# Patient Record
Sex: Female | Born: 1965 | Race: White | Hispanic: No | Marital: Married | Smoking: Never smoker
Health system: Southern US, Community
[De-identification: ages and names within clinical notes are randomized; demographics above are authoritative.]

## PROBLEM LIST (undated history)

## (undated) DIAGNOSIS — F329 Major depressive disorder, single episode, unspecified: Secondary | ICD-10-CM

## (undated) DIAGNOSIS — R5383 Other fatigue: Secondary | ICD-10-CM

## (undated) DIAGNOSIS — F32A Depression, unspecified: Secondary | ICD-10-CM

## (undated) DIAGNOSIS — Z87898 Personal history of other specified conditions: Secondary | ICD-10-CM

## (undated) DIAGNOSIS — I459 Conduction disorder, unspecified: Secondary | ICD-10-CM

## (undated) HISTORY — PX: OTHER SURGICAL HISTORY: SHX169

## (undated) HISTORY — DX: Conduction disorder, unspecified: I45.9

## (undated) HISTORY — DX: Other fatigue: R53.83

## (undated) HISTORY — DX: Personal history of other specified conditions: Z87.898

---

## 1998-08-30 ENCOUNTER — Ambulatory Visit (HOSPITAL_COMMUNITY): Admission: RE | Admit: 1998-08-30 | Discharge: 1998-08-30 | Payer: Self-pay | Admitting: Gastroenterology

## 1999-02-13 ENCOUNTER — Other Ambulatory Visit: Admission: RE | Admit: 1999-02-13 | Discharge: 1999-02-13 | Payer: Self-pay | Admitting: Obstetrics & Gynecology

## 2000-05-14 ENCOUNTER — Other Ambulatory Visit: Admission: RE | Admit: 2000-05-14 | Discharge: 2000-05-14 | Payer: Self-pay | Admitting: Obstetrics and Gynecology

## 2001-05-20 ENCOUNTER — Other Ambulatory Visit: Admission: RE | Admit: 2001-05-20 | Discharge: 2001-05-20 | Payer: Self-pay | Admitting: Obstetrics and Gynecology

## 2002-06-28 ENCOUNTER — Encounter: Payer: Self-pay | Admitting: Obstetrics and Gynecology

## 2002-06-28 ENCOUNTER — Ambulatory Visit (HOSPITAL_COMMUNITY): Admission: RE | Admit: 2002-06-28 | Discharge: 2002-06-28 | Payer: Self-pay | Admitting: Obstetrics and Gynecology

## 2002-11-07 ENCOUNTER — Inpatient Hospital Stay (HOSPITAL_COMMUNITY): Admission: AD | Admit: 2002-11-07 | Discharge: 2002-11-10 | Payer: Self-pay | Admitting: Obstetrics and Gynecology

## 2003-01-27 ENCOUNTER — Other Ambulatory Visit: Admission: RE | Admit: 2003-01-27 | Discharge: 2003-01-27 | Payer: Self-pay | Admitting: *Deleted

## 2004-05-21 ENCOUNTER — Other Ambulatory Visit: Admission: RE | Admit: 2004-05-21 | Discharge: 2004-05-21 | Payer: Self-pay | Admitting: Obstetrics and Gynecology

## 2004-09-01 ENCOUNTER — Emergency Department (HOSPITAL_COMMUNITY): Admission: EM | Admit: 2004-09-01 | Discharge: 2004-09-01 | Payer: Self-pay | Admitting: Emergency Medicine

## 2005-05-03 ENCOUNTER — Encounter: Admission: RE | Admit: 2005-05-03 | Discharge: 2005-05-03 | Payer: Self-pay | Admitting: Obstetrics and Gynecology

## 2005-06-19 ENCOUNTER — Other Ambulatory Visit: Admission: RE | Admit: 2005-06-19 | Discharge: 2005-06-19 | Payer: Self-pay | Admitting: Obstetrics and Gynecology

## 2006-06-04 ENCOUNTER — Encounter: Admission: RE | Admit: 2006-06-04 | Discharge: 2006-06-04 | Payer: Self-pay | Admitting: Obstetrics and Gynecology

## 2007-04-07 DIAGNOSIS — G4733 Obstructive sleep apnea (adult) (pediatric): Secondary | ICD-10-CM | POA: Insufficient documentation

## 2007-04-23 ENCOUNTER — Ambulatory Visit (HOSPITAL_BASED_OUTPATIENT_CLINIC_OR_DEPARTMENT_OTHER): Admission: RE | Admit: 2007-04-23 | Discharge: 2007-04-23 | Payer: Self-pay | Admitting: Family Medicine

## 2007-04-26 ENCOUNTER — Ambulatory Visit: Payer: Self-pay | Admitting: Internal Medicine

## 2007-07-03 ENCOUNTER — Emergency Department (HOSPITAL_COMMUNITY): Admission: EM | Admit: 2007-07-03 | Discharge: 2007-07-03 | Payer: Self-pay | Admitting: Emergency Medicine

## 2007-11-24 ENCOUNTER — Encounter: Admission: RE | Admit: 2007-11-24 | Discharge: 2007-11-24 | Payer: Self-pay | Admitting: Obstetrics and Gynecology

## 2008-12-02 DIAGNOSIS — F320 Major depressive disorder, single episode, mild: Secondary | ICD-10-CM | POA: Insufficient documentation

## 2009-05-18 ENCOUNTER — Encounter: Admission: RE | Admit: 2009-05-18 | Discharge: 2009-05-18 | Payer: Self-pay | Admitting: Obstetrics and Gynecology

## 2009-09-28 ENCOUNTER — Ambulatory Visit: Payer: Self-pay | Admitting: Oncology

## 2009-10-06 LAB — CBC WITH DIFFERENTIAL/PLATELET
BASO%: 0.4 % (ref 0.0–2.0)
Basophils Absolute: 0 10*3/uL (ref 0.0–0.1)
EOS%: 0.9 % (ref 0.0–7.0)
HCT: 37.2 % (ref 34.8–46.6)
HGB: 12.4 g/dL (ref 11.6–15.9)
LYMPH%: 33 % (ref 14.0–49.7)
MCH: 28.5 pg (ref 25.1–34.0)
MCHC: 33.3 g/dL (ref 31.5–36.0)
MCV: 85.9 fL (ref 79.5–101.0)
MONO%: 12.2 % (ref 0.0–14.0)
NEUT%: 53.5 % (ref 38.4–76.8)
Platelets: 158 10*3/uL (ref 145–400)

## 2009-10-06 LAB — MORPHOLOGY

## 2009-10-06 LAB — CHCC SMEAR

## 2009-10-10 LAB — PROTEIN ELECTROPHORESIS, SERUM
Albumin ELP: 60.7 % (ref 55.8–66.1)
Alpha-2-Globulin: 9.8 % (ref 7.1–11.8)
Beta Globulin: 7.7 % — ABNORMAL HIGH (ref 4.7–7.2)
Total Protein, Serum Electrophoresis: 6.3 g/dL (ref 6.0–8.3)

## 2009-10-10 LAB — COMPREHENSIVE METABOLIC PANEL
ALT: 11 U/L (ref 0–35)
AST: 11 U/L (ref 0–37)
CO2: 25 mEq/L (ref 19–32)
Calcium: 8.8 mg/dL (ref 8.4–10.5)
Chloride: 105 mEq/L (ref 96–112)
Creatinine, Ser: 0.78 mg/dL (ref 0.40–1.20)
Sodium: 138 mEq/L (ref 135–145)
Total Protein: 6.3 g/dL (ref 6.0–8.3)

## 2009-10-10 LAB — IRON AND TIBC
%SAT: 67 % — ABNORMAL HIGH (ref 20–55)
Iron: 287 ug/dL — ABNORMAL HIGH (ref 42–145)

## 2009-10-10 LAB — HEPATITIS B SURFACE ANTIGEN: Hepatitis B Surface Ag: NEGATIVE

## 2009-10-10 LAB — HEPATITIS B SURFACE ANTIBODY,QUALITATIVE: Hep B S Ab: NEGATIVE

## 2009-10-10 LAB — ANA: Anti Nuclear Antibody(ANA): NEGATIVE

## 2009-10-10 LAB — VITAMIN B12: Vitamin B-12: 198 pg/mL — ABNORMAL LOW (ref 211–911)

## 2009-10-10 LAB — FERRITIN: Ferritin: 6 ng/mL — ABNORMAL LOW (ref 10–291)

## 2009-12-07 ENCOUNTER — Ambulatory Visit: Payer: Self-pay | Admitting: Oncology

## 2009-12-08 LAB — CBC WITH DIFFERENTIAL/PLATELET
Basophils Absolute: 0 10*3/uL (ref 0.0–0.1)
Eosinophils Absolute: 0 10*3/uL (ref 0.0–0.5)
HCT: 33 % — ABNORMAL LOW (ref 34.8–46.6)
LYMPH%: 28.1 % (ref 14.0–49.7)
MCV: 89.9 fL (ref 79.5–101.0)
MONO%: 8.3 % (ref 0.0–14.0)
NEUT#: 3.7 10*3/uL (ref 1.5–6.5)
NEUT%: 62.6 % (ref 38.4–76.8)
Platelets: 152 10*3/uL (ref 145–400)
RBC: 3.68 10*6/uL — ABNORMAL LOW (ref 3.70–5.45)

## 2009-12-08 LAB — CHCC SMEAR

## 2010-02-07 ENCOUNTER — Ambulatory Visit: Payer: Self-pay | Admitting: Oncology

## 2010-02-09 LAB — CBC WITH DIFFERENTIAL/PLATELET
Basophils Absolute: 0 10*3/uL (ref 0.0–0.1)
EOS%: 0.8 % (ref 0.0–7.0)
Eosinophils Absolute: 0 10*3/uL (ref 0.0–0.5)
HCT: 38.1 % (ref 34.8–46.6)
HGB: 12.9 g/dL (ref 11.6–15.9)
MCH: 30 pg (ref 25.1–34.0)
MCV: 88.6 fL (ref 79.5–101.0)
MONO%: 8.3 % (ref 0.0–14.0)
NEUT#: 3.9 10*3/uL (ref 1.5–6.5)
NEUT%: 62.9 % (ref 38.4–76.8)
lymph#: 1.7 10*3/uL (ref 0.9–3.3)

## 2010-04-11 ENCOUNTER — Ambulatory Visit: Payer: Self-pay | Admitting: Oncology

## 2010-04-13 LAB — CBC WITH DIFFERENTIAL/PLATELET
Basophils Absolute: 0 10*3/uL (ref 0.0–0.1)
EOS%: 1 % (ref 0.0–7.0)
HCT: 38.9 % (ref 34.8–46.6)
HGB: 13.6 g/dL (ref 11.6–15.9)
LYMPH%: 30.3 % (ref 14.0–49.7)
MCH: 31.1 pg (ref 25.1–34.0)
MCV: 89.3 fL (ref 79.5–101.0)
MONO%: 10.5 % (ref 0.0–14.0)
NEUT%: 57.7 % (ref 38.4–76.8)
Platelets: 168 10*3/uL (ref 145–400)

## 2010-04-13 LAB — COMPREHENSIVE METABOLIC PANEL
AST: 11 U/L (ref 0–37)
Alkaline Phosphatase: 44 U/L (ref 39–117)
BUN: 14 mg/dL (ref 6–23)
Creatinine, Ser: 0.86 mg/dL (ref 0.40–1.20)

## 2010-10-30 ENCOUNTER — Other Ambulatory Visit: Payer: Self-pay | Admitting: Obstetrics and Gynecology

## 2010-10-30 DIAGNOSIS — Z1231 Encounter for screening mammogram for malignant neoplasm of breast: Secondary | ICD-10-CM

## 2010-11-01 ENCOUNTER — Ambulatory Visit
Admission: RE | Admit: 2010-11-01 | Discharge: 2010-11-01 | Disposition: A | Discharge status: Home / Self Care | Payer: 59 | Source: Ambulatory Visit | Attending: Obstetrics and Gynecology | Admitting: Obstetrics and Gynecology

## 2010-11-01 DIAGNOSIS — Z1231 Encounter for screening mammogram for malignant neoplasm of breast: Secondary | ICD-10-CM

## 2010-11-20 NOTE — Procedures (Signed)
Gina Spencer, RIEGER        ACCOUNT NO.:  [id number]   MEDICAL RECORD NO.:  [id number]           PATIENT TYPE:  OUT   LOCATION:  SLEEP CENTER                 FACILITY:  [facility]   PHYSICIAN:  Clinton D. Maple Hudson, MD, FCCP, FACPDATE OF BIRTH:  1965/10/[birth year]   DATE OF STUDY:  04/23/2007                            NOCTURNAL POLYSOMNOGRAM   REFERRING PHYSICIAN:  Tammy R. Collins Scotland, M.D.   INDICATION FOR STUDY:  Hypersomnia with sleep apnea.   EPWORTH SLEEPINESS SCORE:  14/24.  BMI 32.9, weight 198 pounds.   MEDICATIONS:  Home medications are listed and reviewed.   SLEEP ARCHITECTURE:  Total sleep time 288 minutes with sleep efficiency  69%.  Stage I was 3%, stage II 60%, stage III absent.  REM 6% of total  sleep time.  Sleep latency 59 minutes, REM latency 268 minutes.  Awake  after sleep onset 69 minutes.  Arousal index 12.6.  Tandem was taken at  bedtime.   RESPIRATORY DATA:  Apnea/hypopnea index (AHI, RDI) 6.5 obstructive  events per hour indicating mild obstructive sleep apnea/hypopnea  syndrome (normal range 0-5 per hour).  This reflected a total of four  mixed apneas and 207 hypopneas.  Most sleep and events were recorded  while supine.  REM AHI 28.7.  There were insufficient events to permit  CPAP titration by split protocol on this study night.   OXYGEN DATA:  Mild to moderate snoring with oxygen desaturation to a  nadir of 80%.  Mean oxygen saturation through the study was 95% on room  air.   CARDIAC DATA:  Normal sinus rhythm.   MOVEMENT-PARASOMNIA:  A total of 32 limb jerks were recorded of which  seven were associated with arousal or awakening for a periodic limb  movement with arousal index of 1.5 per hour which is of doubtful  significance.   IMPRESSIONS-RECOMMENDATIONS:  1. Very mild obstructive sleep apnea/hypopnea syndrome, AHI 6.5 per      hour (normal range 0-5 per hour).  Events were more common while      supine suggesting that treatment may emphasize an  encouragement to      sleep on flatter back.  Mild to moderate snoring with oxygen      desaturation to a nadir of 80%.  2. The patient raised question of bruxism, but none was observed on      this study.  3. Minimal periodic limb movement in sleep with arousal, 1.5 per hour.      Clinton D. Maple Hudson, MD, [facility], FACP  Diplomate, Biomedical engineer of Sleep Medicine  Electronically Signed     CDY/MEDQ  D:  04/26/2007 12:23:44  T:  04/27/2007 11:40:55  Job:  [id number]

## 2010-11-23 NOTE — H&P (Signed)
NAMEHINDY, PERRAULT                    ACCOUNT NO.:  [id number]   MEDICAL RECORD NO.:  [id number]                   PATIENT TYPE:  INP   LOCATION:  9147                                 FACILITY:  [facility]   PHYSICIAN:  Janine Limbo, M.D.            DATE OF BIRTH:  1965-12-04   DATE OF ADMISSION:  11/07/2002  DATE OF DISCHARGE:                                HISTORY & PHYSICAL   HISTORY OF PRESENT ILLNESS:  [name] is a 45 year old Gravida III,  Para I, 0/1/1 at 67 and 5/7th weeks with estimated date of delivery of Nov 29, 2002 who presents with spontaneous rupture of membranes for clear fluid.  Questionable time of rupture of membrane, possible as long as yesterday, Nov 06, 2002 at 1:30 to 2:00 p.m. in the afternoon. She reports positive fetal  movement. No bleeding. She is contracting mildly and irregularly. The  patient had a repeat Cesarean section scheduled for Nov 18, 2002. Her  pregnancy has been followed at [facility] and is remarkable for 1. Pervious Cesarean section, desires repeat.  2. Advanced maternal age. The patient had CVS testing early in the pregnancy  which was within normal limits. 3. She has a history of trisomy 30 with a  previous pregnancy which she ended in termination. 4. History of  cryosurgery. 5. History of preterm labor with term delivery. 6. History of  pruritus. 7. Positive risk with negative titers. 8. Group B strep negative.   OB HISTORY:  In 1996, the patient had a primary low transverse Cesarean  section at term with the birth of a 7 pound female infant named Gina Spencer. In  2003, the patient had a second trimester pregnancy with a trisomy 21 fetus  with termination and the present pregnancy.   ALLERGIES:  No known drug allergies.   SOCIAL HISTORY:  Denies the use of tobacco, alcohol or illicit drugs.   HOSPITAL COURSE:  The patient was initially evaluated at the office at  [facility] on March 29, 2002  at approximately six weeks gestation. Estimated date of confinement  determined by date and confirmed with pregnancy ultrasound. She had a CVS  testing early in the pregnancy which was within normal limits. Ultrasound at  18 weeks showed normal anatomy and growth. The patient has been size equal  to dates throughout. Normotensive with no proteinuria. Her pregnancy has  otherwise, been unremarkable.   PREMEDICATION:  On March 29, 2002 revealed hemoglobin and hematocrit  13.6 and 40.4. Platelets 156,000. Blood type and Rh 0+. Antibody screen  negative. Toxic titer negative. VDRL nonreactive. Rubella immune. Hepatitis  B surface antigen negative. CF testing negative. CVS within normal limits.  PAP smear showed benign reactive changes. GC and Chlamydia negative. One  hour glucose challenge at 18 weeks was 64 and at 28 weeks, one hour glucose  challenge was 141. Three hour GTT was within  normal limits. Hemoglobin at 28  weeks was 12.5. Culture of the vaginal tract at 36 weeks negative for group  B strep.   PAST MEDICAL HISTORY:  Significant for abnormal PAP smear with cryosurgery  in 1995. History of Chlamydia in 1987 and condyloma. The patient with a  history of colitis. She has a history of migraine headaches but none since  1996.   FAMILY HISTORY:  Maternal grandfather with a history of chronic  hypertension. The patient and her mother with history of anemia. Maternal  grandfather with history of chronic obstructive pulmonary disease and  diabetes mellitus. Paternal grandfather with history of stroke. Maternal  grandmother with Alzheimer's disease.   GENETIC HISTORY:  The patient is age 52 years. Her previous pregnancy found  trisomy 45 and patient ended that pregnancy with termination.   SOCIAL HISTORY:  The patient is a 45 year old married Caucasian female. Her  husband, Gina Spencer, is involved and supportive. They  are Ep[employer] in their  faith.   REVIEW OF SYSTEMS:  Is as described above. The patient is typical of one  with uterine pregnancy at 63 and 1/2 weeks with premature rupture of  membranes. Clear fluids. No labor.   PHYSICAL EXAMINATION:  VITAL SIGNS: Stable. Afebrile.  HEENT: Unremarkable.  HEART: Regular rate and rhythm.  LUNGS: Clear.  ABDOMEN: Gravid in it's contour.  GU: Uterine fundus is noted to extend 37 cm above the level of the pubic  symphysis. Leopold's maneuver is longitudinal and cephalic presentation.  Estimated fetal weight is 6 pounds. Fetal heart rate baseline is 150's.  Positive variability. Positive accelerations. Reactive. Contractions are  mild and irregular. Sterile speculum examination finds positive pooling,  positive nitrazine, positive fern. Cervix is long and closed.  EXTREMITIES: No pathologic edema. Deep tendon reflexes are 1+ with no  clonus.   ASSESSMENT:  Intrauterine pregnancy at 41 and 1/2 weeks with premature  rupture of membranes.   PLAN:  Admit per Dr. Marline Backbone. Prepare for Cesarean section.     Rica Koyanagi, C.N.M.               Janine Limbo, M.D.    SDM/MEDQ  D:  11/07/2002  T:  11/07/2002  Job:  [id number]

## 2010-11-23 NOTE — Discharge Summary (Signed)
   Gina Spencer, Gina Spencer                    ACCOUNT NO.:  [id number]   MEDICAL RECORD NO.:  [id number]                   PATIENT TYPE:  INP   LOCATION:  9147                                 FACILITY:  [facility]   PHYSICIAN:  Naima A. Dillard, M.D.              DATE OF BIRTH:  Oct 10, 1965   DATE OF ADMISSION:  11/07/2002  DATE OF DISCHARGE:  11/10/2002                                 DISCHARGE SUMMARY   ADMISSION DIAGNOSES:  1. Intrauterine pregnancy at 36-1/2 weeks.  2. Premature rupture of membranes.  3. Prior cesarean section, desires repeat.   PROCEDURE:  Repeat low-transverse cesarean section.   DISCHARGE DIAGNOSES:  1. Intrauterine pregnancy at 36-1/2 weeks.  2. Premature rupture of membranes.  3. Prior cesarean section, desires repeat.   HISTORY:  The patient is a 45 year old gravida 3, para 1-0-1-1 who presents  at 36-1/2 weeks with rupture of membranes.  She had a previous C-section and  planned a repeat C-section on Nov 18, 2002.  In light of premature rupture  of membranes, she underwent a repeat low-transverse cesarean section on  11/07/2002 by Dr. Marline Backbone with the birth of a 7-pound-1-ounce female  infant named Delorise Shiner with Apgar scores of 9 at 1 minute, 9 at 5 minutes.  Patient has done well in the postoperative period.  Her vital signs have  remained stable.  Her hemoglobin on the first postoperative day was 11.2.  Her vital signs have remained stable.  Her incision is clean, dry, and  intact.  Her baby has remained stable and done well with the exception of  increasing levels of jaundice; there is a possibility that baby may need to  remain in the hospital one extra day secondary to this condition, and if  this is the case, patient will remain overnight with the baby as the  patient, although the patient herself will be discharged.   DISCHARGE INSTRUCTIONS:  Per [facility] handout.   DISCHARGE MEDICATIONS:  1. Motrin 600 mg p.o. q.6h. p.r.n.  pain.  2. Tylox one to two p.o. q.3-4h. pain.  3. Prenatal vitamins.   DISCHARGE FOLLOW UP:  Will be at [facility] in six weeks.     Rica Koyanagi, C.N.M.               Naima A. Normand Sloop, M.D.    SDM/MEDQ  D:  11/10/2002  T:  11/10/2002  Job:  [id number]

## 2010-11-23 NOTE — Op Note (Signed)
Gina Spencer, Gina Spencer                    ACCOUNT NO.:  [id number]   MEDICAL RECORD NO.:  [id number]                   PATIENT TYPE:  INP   LOCATION:  9147                                 [facility]ILITY[facility]   PHYSICIAN:  Janine Limbo, M.D.            DATE OF BIRTH:  04-Jun-1966   DATE OF PROCEDURE:  11/07/2002  DATE OF DISCHARGE:                                 OPERATIVE REPORT   PREOPERATIVE DIAGNOSES:  1. Thirty-six weeks' gestation.  2. Prior cesarean section.  3. Desires repeat cesarean section.  4. Preterm rupture of membranes.   POSTOPERATIVE DIAGNOSES:  1. Thirty-six weeks' gestation.  2. Prior cesarean section.  3. Desires repeat cesarean section.  4. Preterm rupture of membranes.   PROCEDURE:  Repeat low transverse cesarean section.   SURGEON:  Janine Limbo, M.D.   ASSISTANT:  Rica Koyanagi, C.N.M.   ANESTHESIA:  Spinal.   DISPOSITION:  The patient is a 45 year old female, gravida 3, para 1-0-1-1,  who presents at 92 weeks' gestation with ruptured membranes.  She has had a  prior cesarean section, and she desires repeat cesarean section.  She  understands the indications for her surgical procedure and she accepts the  risks of, but not limited to, anesthetic complications, bleeding, infection,  and possible damage to the surrounding organs.   FINDINGS:  A 7 pound 1 ounce female infant Delorise Shiner) was delivered from a  cephalic presentation.  The Apgars were 9 at one minute and 9 at five  minutes.  The fallopian tubes, ovaries, and the uterus were normal for the  gravid state.  The placenta appeared normal.   DESCRIPTION OF PROCEDURE:  The patient was taken to the operating room,  where a spinal anesthetic was given.  The patient's abdomen and perineum  were prepped with multiple layers of Betadine and then sterilely draped.  A  Foley catheter was previously placed.  A low transverse incision was made in  the abdomen and carried sharply through  the subcutaneous tissue, the fascia,  and the anterior peritoneum.  An incision was made in the lower uterine  segment and extended transversely.  The fetal head was delivered without  difficulty.  The mouth and nose were suctioned.  The remainder of the infant  was delivered.  Routine cord blood studies were obtained.  The placenta was  removed.  The uterine cavity was cleaned of amniotic fluid, clotted blood,  and membranes.  The uterine incision was closed using a running locking  suture of 2-0 Vicryl followed by an imbricating suture of 2-0 Vicryl.  The  pelvic cavity was vigorously irrigated.  Hemostasis was adequate.  The  anterior peritoneum and the abdominal musculature were reapproximated in the  midline using 2-0 Vicryl.  The fascia was closed using a running suture of 3-  0 Vicryl followed by three interrupted sutures of 0 Vicryl.  The  subcutaneous layer was vigorously irrigated.  The fascia was irrigated.  Hemostasis was adequate.  The subcutaneous layer was closed using a running  suture of 0 Vicryl.  The skin was reapproximated using a subcuticular suture  of 4-0 Vicryl.  The sponge, needle, and instrument counts were correct on  two occasions.  The estimated blood loss for the procedure was 700 mL.  The  patient tolerated her procedure well.  The patient was  taken to the  recovery room in stable condition.  The infant was taken to the full-term  nursery in stable condition.                                               Janine Limbo, M.D.    AVS/MEDQ  D:  11/07/2002  T:  11/08/2002  Job:  [id number]

## 2012-04-02 DIAGNOSIS — E785 Hyperlipidemia, unspecified: Secondary | ICD-10-CM | POA: Insufficient documentation

## 2012-07-22 ENCOUNTER — Other Ambulatory Visit (HOSPITAL_COMMUNITY): Payer: Self-pay | Admitting: Cardiology

## 2012-07-22 DIAGNOSIS — R9431 Abnormal electrocardiogram [ECG] [EKG]: Secondary | ICD-10-CM

## 2012-07-22 DIAGNOSIS — R0602 Shortness of breath: Secondary | ICD-10-CM

## 2012-07-31 ENCOUNTER — Ambulatory Visit (HOSPITAL_COMMUNITY)
Admission: RE | Admit: 2012-07-31 | Discharge: 2012-07-31 | Disposition: A | Discharge status: Home / Self Care | Payer: 59 | Source: Ambulatory Visit | Attending: Cardiology | Admitting: Cardiology

## 2012-07-31 DIAGNOSIS — R0602 Shortness of breath: Secondary | ICD-10-CM | POA: Insufficient documentation

## 2012-07-31 DIAGNOSIS — R9431 Abnormal electrocardiogram [ECG] [EKG]: Secondary | ICD-10-CM | POA: Insufficient documentation

## 2012-11-09 ENCOUNTER — Encounter: Payer: Self-pay | Admitting: Cardiology

## 2012-12-17 ENCOUNTER — Encounter (HOSPITAL_COMMUNITY): Payer: Self-pay

## 2012-12-17 ENCOUNTER — Emergency Department (HOSPITAL_COMMUNITY)
Admission: EM | Admit: 2012-12-17 | Discharge: 2012-12-18 | Disposition: A | Discharge status: Home / Self Care | Payer: 59 | Attending: Emergency Medicine | Admitting: Emergency Medicine

## 2012-12-17 DIAGNOSIS — R110 Nausea: Secondary | ICD-10-CM | POA: Insufficient documentation

## 2012-12-17 DIAGNOSIS — R509 Fever, unspecified: Secondary | ICD-10-CM | POA: Insufficient documentation

## 2012-12-17 DIAGNOSIS — Z880 Allergy status to penicillin: Secondary | ICD-10-CM | POA: Insufficient documentation

## 2012-12-17 DIAGNOSIS — N390 Urinary tract infection, site not specified: Secondary | ICD-10-CM | POA: Insufficient documentation

## 2012-12-17 DIAGNOSIS — F329 Major depressive disorder, single episode, unspecified: Secondary | ICD-10-CM | POA: Insufficient documentation

## 2012-12-17 DIAGNOSIS — R350 Frequency of micturition: Secondary | ICD-10-CM | POA: Insufficient documentation

## 2012-12-17 DIAGNOSIS — F3289 Other specified depressive episodes: Secondary | ICD-10-CM | POA: Insufficient documentation

## 2012-12-17 DIAGNOSIS — Z79899 Other long term (current) drug therapy: Secondary | ICD-10-CM | POA: Insufficient documentation

## 2012-12-17 DIAGNOSIS — N200 Calculus of kidney: Secondary | ICD-10-CM | POA: Insufficient documentation

## 2012-12-17 HISTORY — DX: Depression, unspecified: F32.A

## 2012-12-17 HISTORY — DX: Major depressive disorder, single episode, unspecified: F32.9

## 2012-12-17 LAB — URINALYSIS, ROUTINE W REFLEX MICROSCOPIC
Glucose, UA: NEGATIVE mg/dL
Specific Gravity, Urine: 1.018 (ref 1.005–1.030)

## 2012-12-17 LAB — URINE MICROSCOPIC-ADD ON

## 2012-12-17 LAB — CBC WITH DIFFERENTIAL/PLATELET
Eosinophils Relative: 1 % (ref 0–5)
HCT: 39.1 % (ref 36.0–46.0)
Hemoglobin: 13.3 g/dL (ref 12.0–15.0)
Lymphocytes Relative: 18 % (ref 12–46)
Lymphs Abs: 1.7 10*3/uL (ref 0.7–4.0)
MCH: 31.8 pg (ref 26.0–34.0)
MCV: 93.5 fL (ref 78.0–100.0)
Monocytes Absolute: 0.9 10*3/uL (ref 0.1–1.0)
Monocytes Relative: 9 % (ref 3–12)
Platelets: 134 10*3/uL — ABNORMAL LOW (ref 150–400)
RBC: 4.18 MIL/uL (ref 3.87–5.11)
WBC: 9.4 10*3/uL (ref 4.0–10.5)

## 2012-12-17 LAB — BASIC METABOLIC PANEL
BUN: 13 mg/dL (ref 6–23)
CO2: 27 mEq/L (ref 19–32)
Calcium: 8.9 mg/dL (ref 8.4–10.5)
Glucose, Bld: 133 mg/dL — ABNORMAL HIGH (ref 70–99)
Sodium: 140 mEq/L (ref 135–145)

## 2012-12-17 MED ORDER — HYDROMORPHONE HCL PF 1 MG/ML IJ SOLN: 0.5000 mg | Freq: Once | INTRAMUSCULAR | Status: DC

## 2012-12-17 MED ORDER — HYDROMORPHONE HCL PF 1 MG/ML IJ SOLN
1.0000 mg | Freq: Once | INTRAMUSCULAR | Status: AC
  Administered 2012-12-17: 1 mg via INTRAVENOUS
  Filled 2012-12-17: qty 1

## 2012-12-17 NOTE — ED Provider Notes (Signed)
History     CSN: [id number]  Arrival date & time 12/17/12  2226   First MD Initiated Contact with Patient 12/17/12 2258      Chief Complaint  Patient presents with  . Flank Pain    (Consider location/radiation/quality/duration/timing/severity/associated sxs/prior treatment) HPI  Gina Spencer is a 47 y.o. female complaining of right flank pain that radiates to right groin associated with urinary frequency, subjective fever and chills and nausea. Onset worsening over the last 48 hours. Patient has a single has a history of single kidney stone approximately 10 years ago, she did not require lithotripsy or intervention to passed stone. Denies dysuria, change in bowel habits Past Medical History  Diagnosis Date  . Depression     History reviewed. No pertinent past surgical history.  History reviewed. No pertinent family history.  History  Substance Use Topics  . Smoking status: Not on file  . Smokeless tobacco: Not on file  . Alcohol Use: No    OB History   Grav Para Term Preterm Abortions TAB SAB Ect Mult Living                  Review of Systems  Constitutional: Positive for fever and chills.  Respiratory: Negative for shortness of breath.   Cardiovascular: Negative for chest pain.  Gastrointestinal: Positive for nausea. Negative for vomiting, abdominal pain and diarrhea.  Genitourinary: Positive for frequency and flank pain. Negative for dysuria.  All other systems reviewed and are negative.    Allergies  Penicillins  Home Medications   Current Outpatient Rx  Name  Route  Sig  Dispense  Refill  . buPROPion (WELLBUTRIN) 100 MG tablet   Oral   Take 100 mg by mouth 3 (three) times daily.            BP 119/97  Pulse 82  Temp(Src) 98.4 F (36.9 C) (Oral)  Resp 20  SpO2 98%  Physical Exam  Nursing note and vitals reviewed. Constitutional: She is oriented to person, place, and time. She appears well-developed and well-nourished. No distress.   Appears acutely uncomfortable, landing on her left side, writhing  HENT:  Head: Normocephalic.  Mouth/Throat: Oropharynx is clear and moist.  Eyes: Conjunctivae and EOM are normal. Pupils are equal, round, and reactive to light.  Neck: Normal range of motion.  Cardiovascular: Normal rate, regular rhythm and intact distal pulses.   Pulmonary/Chest: Effort normal and breath sounds normal. No stridor. No respiratory distress. She has no wheezes. She has no rales. She exhibits no tenderness.  Abdominal: Soft. Bowel sounds are normal. She exhibits no distension and no mass. There is no tenderness. There is no rebound and no guarding.  Genitourinary:  Very mild CVA tenderness on the right side  Musculoskeletal: Normal range of motion.  Neurological: She is alert and oriented to person, place, and time.  Psychiatric: She has a normal mood and affect.    ED Course  Procedures (including critical care time)  Labs Reviewed  URINALYSIS, ROUTINE W REFLEX MICROSCOPIC - Abnormal; Notable for the following:    Color, Urine ORANGE (*)    Hgb urine dipstick TRACE (*)    Bilirubin Urine SMALL (*)    Urobilinogen, UA 2.0 (*)    Nitrite POSITIVE (*)    Leukocytes, UA MODERATE (*)    All other components within normal limits  CBC WITH DIFFERENTIAL - Abnormal; Notable for the following:    Platelets 134 (*)    All other components within normal limits  BASIC METABOLIC PANEL - Abnormal; Notable for the following:    Potassium 3.4 (*)    Glucose, Bld 133 (*)    GFR calc non Af Amer 65 (*)    GFR calc Af Amer 75 (*)    All other components within normal limits  URINE CULTURE  URINE MICROSCOPIC-ADD ON   Ct Abdomen Pelvis Wo Contrast  12/18/2012   *RADIOLOGY REPORT*  Clinical Data: Right flank pain and history of renal calculi.  CT ABDOMEN AND PELVIS WITHOUT CONTRAST  Technique:  Multidetector CT imaging of the abdomen and pelvis was performed following the standard protocol without intravenous  contrast.  Comparison: 09/01/2004  Findings: There is evidence of moderate right hydronephrosis.  As the ureter is followed into the pelvis, a discrete small calculus is present at the ureterovesicle junction measuring approximately 3 mm.  No other renal or ureteral calculi are identified bilaterally. The bladder is unremarkable.  Unenhanced appearance of the liver, gallbladder, pancreas, spleen, adrenal glands and bowel are unremarkable.  No abnormal fluid collection is identified.  No hernias are seen.  No masses or enlarged lymph nodes are identified.  IMPRESSION: Moderate right hydronephrosis and hydroureter due to a 3 mm calculus at the right ureterovesicle junction.   Original Report Authenticated By: Irish Lack, M.D.     12:32 AM Seen and examined at the bedside she states that her pain has improved slightly but is still pretty severe.  1. Kidney stone   2. UTI (lower urinary tract infection)       MDM   Filed Vitals:   12/17/12 2235  BP: 119/97  Pulse: 82  Temp: 98.4 F (36.9 C)  TempSrc: Oral  Resp: 20  SpO2: 98%     Gina Spencer is a 47 y.o. female with right flank pain, afebrile nonseptic appearing urinalysis has nitrites and leukocytes but very few white blood cells. CT shows Robert moderate right hydronephrosis and a 3 mm stone at the right uretero-Vesicular junction.   Urology consult from Dr. Irene Pap appreciated: He feels that the patient is appropriate for discharge with close followup with his office.   Extensive discussions of return precautions with her and her daughter. Patient verbalized their understanding and repeated red flags back to me.   Medications  HYDROmorphone (DILAUDID) injection 1 mg (1 mg Intravenous Given 12/17/12 2343)  ketorolac (TORADOL) 30 MG/ML injection 30 mg (30 mg Intravenous Given 12/18/12 0042)  HYDROmorphone (DILAUDID) injection 1 mg (1 mg Intravenous Given 12/18/12 0042)  cefTRIAXone (ROCEPHIN) 1 g in dextrose 5 % 50 mL  IVPB (0 g Intravenous Stopped 12/18/12 0239)  HYDROmorphone (DILAUDID) injection 1 mg (1 mg Intravenous Given 12/18/12 0257)    The patient is hemodynamically stable, appropriate for, and amenable to, discharge at this time. Pt verbalized understanding and agrees with care plan. Outpatient follow-up and return precautions given.    Discharge Medication List as of 12/18/2012  2:06 AM    START taking these medications   Details  cephALEXin (KEFLEX) 500 MG capsule Take 1 capsule (500 mg total) by mouth 2 (two) times daily., Starting 12/18/2012, Until Discontinued, Print    oxyCODONE-acetaminophen (PERCOCET/ROXICET) 5-325 MG per tablet 1 to 2 tabs PO q6hrs  PRN for pain, Print               Wynetta Emery, PA-C 12/18/12 [phone]

## 2012-12-17 NOTE — ED Notes (Signed)
Pt complains of right flank pain that started this evening, hx of kidney stones, pain is the same

## 2012-12-18 ENCOUNTER — Emergency Department (HOSPITAL_COMMUNITY): Payer: 59

## 2012-12-18 ENCOUNTER — Telehealth (HOSPITAL_COMMUNITY): Payer: Self-pay | Admitting: Emergency Medicine

## 2012-12-18 ENCOUNTER — Encounter (HOSPITAL_COMMUNITY): Payer: Self-pay

## 2012-12-18 MED ORDER — HYDROMORPHONE HCL PF 1 MG/ML IJ SOLN
1.0000 mg | Freq: Once | INTRAMUSCULAR | Status: AC
  Administered 2012-12-18: 1 mg via INTRAVENOUS
  Filled 2012-12-18: qty 1

## 2012-12-18 MED ORDER — CEPHALEXIN 500 MG PO CAPS: 500.0000 mg | ORAL_CAPSULE | Freq: Two times a day (BID) | ORAL | Status: DC

## 2012-12-18 MED ORDER — DEXTROSE 5 % IV SOLN
1.0000 g | Freq: Once | INTRAVENOUS | Status: AC
  Administered 2012-12-18: 1 g via INTRAVENOUS
  Filled 2012-12-18: qty 10

## 2012-12-18 MED ORDER — KETOROLAC TROMETHAMINE 30 MG/ML IJ SOLN
30.0000 mg | Freq: Once | INTRAMUSCULAR | Status: AC
  Administered 2012-12-18: 30 mg via INTRAVENOUS
  Filled 2012-12-18: qty 1

## 2012-12-18 MED ORDER — OXYCODONE-ACETAMINOPHEN 5-325 MG PO TABS: ORAL_TABLET | ORAL | Status: DC

## 2012-12-19 LAB — URINE CULTURE: Culture: NO GROWTH

## 2012-12-19 NOTE — ED Provider Notes (Signed)
Medical screening examination/treatment/procedure(s) were performed by non-physician practitioner and as supervising physician I was immediately available for consultation/collaboration.  Sunnie Nielsen, MD 12/19/12 2322

## 2013-03-02 ENCOUNTER — Other Ambulatory Visit: Payer: Self-pay

## 2013-03-02 DIAGNOSIS — Z1231 Encounter for screening mammogram for malignant neoplasm of breast: Secondary | ICD-10-CM

## 2013-03-23 ENCOUNTER — Ambulatory Visit
Admission: RE | Admit: 2013-03-23 | Discharge: 2013-03-23 | Disposition: A | Discharge status: Home / Self Care | Payer: 59 | Source: Ambulatory Visit

## 2013-03-23 DIAGNOSIS — Z1231 Encounter for screening mammogram for malignant neoplasm of breast: Secondary | ICD-10-CM

## 2014-03-22 ENCOUNTER — Other Ambulatory Visit: Payer: Self-pay

## 2014-03-22 DIAGNOSIS — Z1231 Encounter for screening mammogram for malignant neoplasm of breast: Secondary | ICD-10-CM

## 2014-04-19 ENCOUNTER — Ambulatory Visit
Admission: RE | Admit: 2014-04-19 | Discharge: 2014-04-19 | Disposition: A | Discharge status: Home / Self Care | Payer: 59 | Source: Ambulatory Visit

## 2014-04-19 DIAGNOSIS — Z1231 Encounter for screening mammogram for malignant neoplasm of breast: Secondary | ICD-10-CM

## 2014-09-28 ENCOUNTER — Ambulatory Visit (INDEPENDENT_AMBULATORY_CARE_PROVIDER_SITE_OTHER): Payer: 59

## 2014-09-28 ENCOUNTER — Ambulatory Visit: Payer: 59

## 2014-09-28 DIAGNOSIS — M79672 Pain in left foot: Secondary | ICD-10-CM | POA: Diagnosis not present

## 2014-09-28 DIAGNOSIS — M7751 Other enthesopathy of right foot: Secondary | ICD-10-CM

## 2014-09-28 DIAGNOSIS — M722 Plantar fascial fibromatosis: Secondary | ICD-10-CM

## 2014-09-28 DIAGNOSIS — M79671 Pain in right foot: Secondary | ICD-10-CM

## 2014-09-28 DIAGNOSIS — M7732 Calcaneal spur, left foot: Secondary | ICD-10-CM | POA: Diagnosis not present

## 2014-09-28 NOTE — Progress Notes (Signed)
   Subjective:    Patient ID: Gina Spencer, female    DOB: [birth year], 49 y.o.   MRN: [id number]  HPI  Pt presents with bilateral foot pain from PF, left worse  Review of Systems  Hematological: Bruises/bleeds easily.  All other systems reviewed and are negative.      Objective:   Physical Exam 49 year old white female well-developed well-nourished or 23 presents at this time on referral from Dr. Elijah Spencer for right heel and foot pain. Patient has been through a series of 4 injections as orthoses wears a night splint has been on anti-inflammatories wears appropriate types of shoes has been stretching exercises ice and a variety of treatments trying to adjust her activity levels however continues to have recalcitrant inferior calcaneal pain. X-rays confirm well developed inferior calcaneal spur fascial thickening left foot right foot has some mild capsular pain and discomfort around the first MTP joint with small osteophyte noted promontory changes which would likely continue to benefit from use of orthoses. May have a mild functional hallux limitus. Orthopedic biomechanical exam rectus foot type noted bilateral mild promontory changes subluxation Lisfranc's fourth fifth metatarsal base and cuboid consistent with promontory changes. Well developed inferior Heel spur left more so than right fascial thickening bilateral although left more significant. Fracture stenosis no tumors pulses palpable DP and PT +2 over 4 bilateral capillary refill time 3 seconds all digits epicritic and proprioceptive sensations intact and symmetric bilateral. There is normal plantar response and DTRs. Dermatologic the skin color pigment normal absent hair growth noted       Assessment & Plan:  Assessment this time patient does have capsulitis the first MTP joint right with mild functional hallux limitus noted however this is not severely symptomatic and is being comminuted with orthoses. Next  Assessment #2 is  plantar fasciitis/heel spur syndrome left foot plan at this time having failed conservative care continues to have recalcitrant pain symptomology patient is a candidate for surgery briefly discussed surgical options also the options for shockwave therapy were briefly discussed and that option was given to the patient. Patient this time is ready for surgical intervention having failed conservative care wishes to progressive or invasive option at this time in the hopes of alleviating her pain symptomology and a permanent basis. Understands she will have to maintain orthoses afterwards that this is nonicteric for however way of managing her symptomology and strain the fascial fasciotomy or endoscopic plantar fasciotomy is reviewed at this time risks, occasions alternatives are reviewed and consent form was reviewed and signed by the patient. As with her convenience patient will be out of work for a period of its 6-8 weeks as she does have a job requiring standing and walking 8 hours a day minimum. She is advised she will be in a near fracture boot for the first 3 weeks then back into casual walking shoes or tennis shoes and orthoses for 3-6 weeks to 8 weeks. Likely 6-8 weeks before she can return to her regular work activities. All questions asked by the patient answered surgery schedule her convenience with appropriate follow-up to carry it out will monitor for at least 3 months postop patient is advised to exam her from 1-3 months for symptoms to resolve following the surgery  Gina Spencer DPM

## 2014-09-28 NOTE — Patient Instructions (Signed)
Pre-Operative Instructions  Congratulations, you have decided to take an important step to improving your quality of life.  You can be assured that the doctors of [facility] will be with you every step of the way.  1. Plan to be at the surgery center/hospital at least 1 (one) hour prior to your scheduled time unless otherwise directed by the surgical center/hospital staff.  You must have a responsible adult accompany you, remain during the surgery and drive you home.  Make sure you have directions to the surgical center/hospital and know how to get there on time. 2. For hospital based surgery you will need to obtain a history and physical form from your family physician within 1 month prior to the date of surgery- we will give you a form for you primary physician.  3. We make every effort to accommodate the date you request for surgery.  There are however, times where surgery dates or times have to be moved.  We will contact you as soon as possible if a change in schedule is required.   4. No Aspirin/Ibuprofen for one week before surgery.  If you are on aspirin, any non-steroidal anti-inflammatory medications (Mobic, Aleve, Ibuprofen) you should stop taking it 7 days prior to your surgery.  You make take Tylenol  For pain prior to surgery.  5. Medications- If you are taking daily heart and blood pressure medications, seizure, reflux, allergy, asthma, anxiety, pain or diabetes medications, make sure the surgery center/hospital is aware before the day of surgery so they may notify you which medications to take or avoid the day of surgery. 6. No food or drink after midnight the night before surgery unless directed otherwise by surgical center/hospital staff. 7. No alcoholic beverages 24 hours prior to surgery.  No smoking 24 hours prior to or 24 hours after surgery. 8. Wear loose pants or shorts- loose enough to fit over bandages, boots, and casts. 9. No slip on shoes, sneakers are best. 10. Bring  your boot with you to the surgery center/hospital.  Also bring crutches or a walker if your physician has prescribed it for you.  If you do not have this equipment, it will be provided for you after surgery. 11. If you have not been contracted by the surgery center/hospital by the day before your surgery, call to confirm the date and time of your surgery. 12. Leave-time from work may vary depending on the type of surgery you have.  Appropriate arrangements should be made prior to surgery with your employer. 13. Prescriptions will be provided immediately following surgery by your doctor.  Have these filled as soon as possible after surgery and take the medication as directed. 14. Remove nail polish on the operative foot. 15. Wash the night before surgery.  The night before surgery wash the foot and leg well with the antibacterial soap provided and water paying special attention to beneath the toenails and in between the toes.  Rinse thoroughly with water and dry well with a towel.  Perform this wash unless told not to do so by your physician.  Enclosed: 1 Ice pack (please put in freezer the night before surgery)   1 Hibiclens skin cleaner   Pre-op Instructions  If you have any questions regarding the instructions, do not hesitate to call our office.  [place]: [address] [place], [place] [address] [phone]  [place]: [address] [place], [place] [address] [phone]  [place]: [address]  [place], [place] [address] [phone]  Dr. Edwyna Dangerfield   Tuchman DPM, Dr. Norman Regal DPM Dr. Jemari Hallum DPM, Dr. M. Todd Hyatt DPM, Dr. Kathryn Egerton DPM 

## 2014-10-21 ENCOUNTER — Telehealth: Payer: Self-pay | Admitting: *Deleted

## 2014-10-21 ENCOUNTER — Encounter: Payer: Self-pay | Admitting: *Deleted

## 2014-10-21 NOTE — Telephone Encounter (Signed)
Pt states she checked her 2nd pg in the pre-op surgery packet and she is not on any of the medications listed and wants them removed.  I explained to the pt the medications were put on the "Not Taking" list, but pt insisted she had not been on any of the medications except for Wellbutrin and it was 150mg  tid.  I told her I would change the medication and remove the medications.

## 2014-11-07 ENCOUNTER — Ambulatory Visit (INDEPENDENT_AMBULATORY_CARE_PROVIDER_SITE_OTHER): Payer: 59 | Admitting: Podiatry

## 2014-11-07 DIAGNOSIS — M7732 Calcaneal spur, left foot: Secondary | ICD-10-CM

## 2014-11-07 DIAGNOSIS — M722 Plantar fascial fibromatosis: Secondary | ICD-10-CM

## 2014-11-07 DIAGNOSIS — B351 Tinea unguium: Secondary | ICD-10-CM

## 2014-11-07 NOTE — Patient Instructions (Signed)
Pre-Operative Instructions  Congratulations, you have decided to take an important step to improving your quality of life.  You can be assured that the doctors of [facility] will be with you every step of the way.  1. Plan to be at the surgery center/hospital at least 1 (one) hour prior to your scheduled time unless otherwise directed by the surgical center/hospital staff.  You must have a responsible adult accompany you, remain during the surgery and drive you home.  Make sure you have directions to the surgical center/hospital and know how to get there on time. 2. For hospital based surgery you will need to obtain a history and physical form from your family physician within 1 month prior to the date of surgery- we will give you a form for you primary physician.  3. We make every effort to accommodate the date you request for surgery.  There are however, times where surgery dates or times have to be moved.  We will contact you as soon as possible if a change in schedule is required.   4. No Aspirin/Ibuprofen for one week before surgery.  If you are on aspirin, any non-steroidal anti-inflammatory medications (Mobic, Aleve, Ibuprofen) you should stop taking it 7 days prior to your surgery.  You make take Tylenol  For pain prior to surgery.  5. Medications- If you are taking daily heart and blood pressure medications, seizure, reflux, allergy, asthma, anxiety, pain or diabetes medications, make sure the surgery center/hospital is aware before the day of surgery so they may notify you which medications to take or avoid the day of surgery. 6. No food or drink after midnight the night before surgery unless directed otherwise by surgical center/hospital staff. 7. No alcoholic beverages 24 hours prior to surgery.  No smoking 24 hours prior to or 24 hours after surgery. 8. Wear loose pants or shorts- loose enough to fit over bandages, boots, and casts. 9. No slip on shoes, sneakers are best. 10. Bring  your boot with you to the surgery center/hospital.  Also bring crutches or a walker if your physician has prescribed it for you.  If you do not have this equipment, it will be provided for you after surgery. 11. If you have not been contracted by the surgery center/hospital by the day before your surgery, call to confirm the date and time of your surgery. 12. Leave-time from work may vary depending on the type of surgery you have.  Appropriate arrangements should be made prior to surgery with your employer. 13. Prescriptions will be provided immediately following surgery by your doctor.  Have these filled as soon as possible after surgery and take the medication as directed. 14. Remove nail polish on the operative foot. 15. Wash the night before surgery.  The night before surgery wash the foot and leg well with the antibacterial soap provided and water paying special attention to beneath the toenails and in between the toes.  Rinse thoroughly with water and dry well with a towel.  Perform this wash unless told not to do so by your physician.  Enclosed: 1 Ice pack (please put in freezer the night before surgery)   1 Hibiclens skin cleaner   Pre-op Instructions  If you have any questions regarding the instructions, do not hesitate to call our office.  [place]: [address] [place], [place] [address] [phone]  [place]: [address] [place], [place] [address] [phone]  [place]: [address]  [place], [place] [address] [phone]  Dr. Richard   Tuchman DPM, Dr. Norman Regal DPM Dr. Richard Sikora DPM, Dr. M. Todd Hyatt DPM, Dr. Kathryn Egerton DPM 

## 2014-11-08 NOTE — Progress Notes (Signed)
Subjective:     Patient ID: Gina Spencer, female   DOB: 09-09-1965, 49 y.o.   MRN: [id number]  HPI patient presents stating this heel has been killing me and making it increasingly difficult for me to walk. It's been going on for over a year and I wanted it fixed   Review of Systems     Objective:   Physical Exam Neurovascular status intact with muscle strength adequate with severe discomfort medial fascial band left at the insertional point of the tendon the calcaneus. Patient had numerous injections orthotics splints anti-inflammatories without relief of symptoms    Assessment:     Long-term plantar fasciitis left with chronic inflammation present    Plan:     Reviewed condition at great length discussing treatment options and patient is opted for surgical intervention. At this point I allowed patient to read consent form and discussed endoscopic surgery and reviewed all possible alternative treatments and complications as listed. Patient wants surgery understanding complications and understands total recovery. Can take 6 months to one year. Patient is scheduled for outpatient surgery at this time and is dispensed air fracture walker with instructions on usage

## 2014-11-29 ENCOUNTER — Telehealth: Payer: Self-pay | Admitting: *Deleted

## 2014-11-29 NOTE — Telephone Encounter (Signed)
I called and [facility]'s [facility].  Tonya T added the facility to the authorization.  She gave me a new reference number or authorization number.  It is [id number].    I called and informed Aram BeechamCynthia at [facility] that surgery center was added to authorization.  I gave her the new authorization number.

## 2014-12-06 ENCOUNTER — Encounter: Payer: Self-pay | Admitting: Podiatry

## 2014-12-06 DIAGNOSIS — M722 Plantar fascial fibromatosis: Secondary | ICD-10-CM | POA: Diagnosis not present

## 2014-12-07 NOTE — Progress Notes (Signed)
DOS 12/06/2014 Endoscopic release medial band left heel.

## 2014-12-12 ENCOUNTER — Ambulatory Visit (INDEPENDENT_AMBULATORY_CARE_PROVIDER_SITE_OTHER): Payer: 59 | Admitting: Podiatry

## 2014-12-12 VITALS — BP 125/98 | HR 41 | Resp 15

## 2014-12-12 DIAGNOSIS — M722 Plantar fascial fibromatosis: Secondary | ICD-10-CM

## 2014-12-12 MED ORDER — HYDROCODONE-ACETAMINOPHEN 10-325 MG PO TABS
1.0000 | ORAL_TABLET | ORAL | Status: DC | PRN
Start: 2014-12-12 — End: 2018-08-03

## 2014-12-13 ENCOUNTER — Encounter: Payer: 59 | Admitting: Podiatry

## 2014-12-14 NOTE — Progress Notes (Signed)
Subjective:     Patient ID: Gina Spencer, female   DOB: 1966-05-30, 49 y.o.   MRN: [id number]  HPI patient states I'm doing very well with my left heel with minimal discomfort   Review of Systems     Objective:   Physical Exam Neurovascular status intact negative Homans sign noted with wound edges well coapted left heel medial and lateral side    Assessment:     Doing well from endoscopic surgery left    Plan:     Reapplied sterile dressing instructed on continued immobilization and reappoint 2 weeks for stitch removal or earlier if any issues should occur

## 2014-12-26 ENCOUNTER — Ambulatory Visit (INDEPENDENT_AMBULATORY_CARE_PROVIDER_SITE_OTHER): Payer: 59 | Admitting: Podiatry

## 2014-12-26 VITALS — BP 110/74 | HR 74 | Resp 15

## 2014-12-26 DIAGNOSIS — M722 Plantar fascial fibromatosis: Secondary | ICD-10-CM | POA: Diagnosis not present

## 2014-12-27 NOTE — Progress Notes (Signed)
Subjective:     Patient ID: Gina Spencer, female   DOB: 10/06/1965, 49 y.o.   MRN: [id number]  HPI patient presents stating she is doing pretty well   Review of Systems     Objective:   Physical Exam Neurovascular status intact wound edges well coapted with minimal discomfort plantar left    Assessment:     Doing well post endoscopic procedure left    Plan:     Stitches removed sterile dressing applied and compression applied. Reappoint to recheck

## 2015-01-16 ENCOUNTER — Telehealth: Payer: Self-pay | Admitting: *Deleted

## 2015-01-16 NOTE — Telephone Encounter (Addendum)
Pt states her call has 2 questions - 1.  I need work release not stating when I can return to work and restrictions, 2.  My foot still doesn't feel right and I would like to take PT.  Please call.  Dr. Charlsie Merlesegal states it is too early for PT, the pretty normal the sensations she is having, may return to work without restrictions if 10 minute seated rest period every 2 hours.  See letter.

## 2015-01-17 NOTE — Telephone Encounter (Signed)
Don't know anything about her

## 2015-01-18 ENCOUNTER — Encounter: Payer: Self-pay | Admitting: *Deleted

## 2015-01-25 ENCOUNTER — Encounter: Payer: Self-pay | Admitting: *Deleted

## 2015-02-10 ENCOUNTER — Ambulatory Visit (INDEPENDENT_AMBULATORY_CARE_PROVIDER_SITE_OTHER): Payer: 59

## 2015-02-10 ENCOUNTER — Ambulatory Visit (INDEPENDENT_AMBULATORY_CARE_PROVIDER_SITE_OTHER): Payer: 59 | Admitting: Podiatry

## 2015-02-10 DIAGNOSIS — M7732 Calcaneal spur, left foot: Secondary | ICD-10-CM | POA: Diagnosis not present

## 2015-02-10 DIAGNOSIS — Z9889 Other specified postprocedural states: Secondary | ICD-10-CM

## 2015-02-10 DIAGNOSIS — M722 Plantar fascial fibromatosis: Secondary | ICD-10-CM | POA: Diagnosis not present

## 2015-02-10 MED ORDER — TRAMADOL HCL 50 MG PO TABS: 50.0000 mg | ORAL_TABLET | Freq: Three times a day (TID) | ORAL | Status: DC

## 2015-02-11 NOTE — Progress Notes (Signed)
Subjective:     Patient ID: Gina Spencer, female   DOB: 1965-12-31, 49 y.o.   MRN: [id number]  HPI patient states she was having some pain in her arch and she was worried and she's having to work 12 hour days on her foot 6 weeks after having endoscopic heel surgery   Review of Systems     Objective:   Physical Exam Neurovascular status found to be intact no changes in muscle strength or range of motion with mild to moderate discomfort in the arch region left and slight discomfort lateral but no pain in the heel itself that we worked on    Assessment:     Probable inflammatory condition due to excessive hours that the patient has to work due to her job status    Plan:     I reviewed that so far everything looks good and there is no indications of chronic pain syndrome and it is normal still have some discomfort in the arch region at this. Postop especially with the activity levels that she does. Continue with boot with gradual reduction over the next 4 weeks and advised on physical therapy consisting of heat stretch and ice. Patient also is given a prescription for tramadol to try to reduce the inflammation and will be seen back in 4 weeks or earlier if there should be any worsening of symptoms

## 2015-03-07 ENCOUNTER — Encounter: Payer: Self-pay | Admitting: Cardiology

## 2015-04-19 ENCOUNTER — Other Ambulatory Visit: Payer: Self-pay

## 2015-04-19 DIAGNOSIS — Z1231 Encounter for screening mammogram for malignant neoplasm of breast: Secondary | ICD-10-CM

## 2015-05-19 ENCOUNTER — Ambulatory Visit
Admission: RE | Admit: 2015-05-19 | Discharge: 2015-05-19 | Disposition: A | Discharge status: Home / Self Care | Payer: 59 | Source: Ambulatory Visit

## 2015-05-19 DIAGNOSIS — Z1231 Encounter for screening mammogram for malignant neoplasm of breast: Secondary | ICD-10-CM

## 2016-09-23 DIAGNOSIS — R6882 Decreased libido: Secondary | ICD-10-CM | POA: Insufficient documentation

## 2018-08-03 ENCOUNTER — Encounter: Payer: Self-pay | Admitting: Medical

## 2018-08-03 ENCOUNTER — Ambulatory Visit: Payer: 59 | Admitting: Medical

## 2018-08-03 VITALS — BP 102/63 | HR 88 | Temp 99.7°F | Resp 18 | Wt 189.0 lb

## 2018-08-03 DIAGNOSIS — R05 Cough: Secondary | ICD-10-CM

## 2018-08-03 DIAGNOSIS — R059 Cough, unspecified: Secondary | ICD-10-CM

## 2018-08-03 DIAGNOSIS — J40 Bronchitis, not specified as acute or chronic: Secondary | ICD-10-CM

## 2018-08-03 MED ORDER — PREDNISONE 10 MG (21) PO TBPK: ORAL_TABLET | ORAL | 0 refills | Status: DC

## 2018-08-03 MED ORDER — BENZONATATE 100 MG PO CAPS: ORAL_CAPSULE | ORAL | 0 refills | Status: DC

## 2018-08-03 MED ORDER — IPRATROPIUM-ALBUTEROL 0.5-2.5 (3) MG/3ML IN SOLN
3.0000 mL | Freq: Once | RESPIRATORY_TRACT | Status: DC
Start: 2018-08-03 — End: 2018-08-07

## 2018-08-03 MED ORDER — ALBUTEROL SULFATE HFA 108 (90 BASE) MCG/ACT IN AERS: 2.0000 | INHALATION_SPRAY | Freq: Four times a day (QID) | RESPIRATORY_TRACT | 0 refills | Status: DC | PRN

## 2018-08-03 NOTE — Progress Notes (Signed)
Subjective:    Patient ID: Gina Spencer, female    DOB: 1966/04/03, 53 y.o.   MRN: [id number]  HPI goes by the name [place]. 53 yo female in non acute distress. Seen by PCP one week ago test was neg for flu but still treated with Tamiflu. Mild cough initially by Wednesday it was worse, worsening on Friday. CXR on Wednesday showed Left Lower lobe atelectasis.  Temp 100 yesterday Patient came into work today. Yesterday 100.8 temp.  At night taking Mucinex  PM Non smoker  .Blood pressure 102/63, pulse 88, temperature 99.7 F (37.6 C), temperature source Tympanic, resp. rate 18, weight 189 lb (85.7 kg), SpO2 97 %. Allergies  Allergen Reactions  . Penicillins     Review of Systems  Constitutional: Positive for appetite change (decreased), chills, diaphoresis, fatigue and fever.  HENT: Positive for postnasal drip and rhinorrhea (clear). Negative for ear discharge, ear pain, sinus pressure, sinus pain, sneezing and sore throat.   Eyes: Negative for discharge and itching.  Respiratory: Positive for cough and shortness of breath. Negative for chest tightness and wheezing.   Cardiovascular: Negative for chest pain.  Gastrointestinal: Negative for abdominal pain (sore from coughing).  Endocrine: Negative for polydipsia, polyphagia and polyuria.  Genitourinary: Negative for dysuria.  Musculoskeletal: Negative for myalgias (resolved).  Skin: Negative for rash.  Allergic/Immunologic: Negative for environmental allergies and food allergies.  Neurological: Positive for light-headedness (occasionally not eating well) and headaches (occasional). Negative for dizziness.  Hematological: Negative for adenopathy.  Psychiatric/Behavioral: Negative for behavioral problems, confusion, self-injury and suicidal ideas.       Objective:   Physical Exam Vitals signs and nursing note reviewed.  Constitutional:      Appearance: Normal appearance.  HENT:     Head: Normocephalic and  atraumatic.     Right Ear: Ear canal and external ear normal.     Left Ear: Ear canal normal.     Nose: Congestion (mild) present. No rhinorrhea.     Mouth/Throat:     Mouth: Mucous membranes are moist.     Pharynx: Oropharynx is clear.  Eyes:     Extraocular Movements: Extraocular movements intact.     Conjunctiva/sclera: Conjunctivae normal.     Pupils: Pupils are equal, round, and reactive to light.  Neck:     Musculoskeletal: Normal range of motion and neck supple.  Cardiovascular:     Rate and Rhythm: Normal rate and regular rhythm.     Heart sounds: Normal heart sounds.  Pulmonary:     Effort: Pulmonary effort is normal.     Breath sounds: Rales present.  Musculoskeletal: Normal range of motion.  Lymphadenopathy:     Cervical: No cervical adenopathy.  Skin:    General: Skin is warm and dry.     Capillary Refill: Capillary refill takes less than 2 seconds.  Neurological:     General: No focal deficit present.     Mental Status: She is alert and oriented to person, place, and time.  Psychiatric:        Mood and Affect: Mood normal.        Behavior: Behavior normal.        Thought Content: Thought content normal.        Judgment: Judgment normal.    Rales in both mid to bases bilateral lungs.   Coughing in room S/p dueoneb 100 % only rales is mid right side, patient able to take deep breaths with less lcoughing     Assessment &  Plan:  Bronchitis most likely viral, flu  cough Feeling better after Duoneb treatment O2 sat  100 % RA Meds ordered this encounter  Medications  . ipratropium-albuterol (DUONEB) 0.5-2.5 (3) MG/3ML nebulizer solution 3 mL  . predniSONE (STERAPRED UNI-PAK 21 TAB) 10 MG (21) TBPK tablet    Sig: Take  6 tablets by mouth today then 5 tablets tomorrow then one less everyday thereafter. Take with food    Dispense:  21 tablet    Refill:  0  . albuterol (PROVENTIL HFA;VENTOLIN HFA) 108 (90 Base) MCG/ACT inhaler    Sig: Inhale 2 puffs into the  lungs every 6 (six) hours as needed for wheezing or shortness of breath.    Dispense:  1 Inhaler    Refill:  0  . benzonatate (TESSALON) 100 MG capsule    Sig: Swallow 1-2 capsules every  8 hours as needed for cough.    Dispense:  20 capsule    Refill:  0  Return in 2 days for a recheck here or with your PCP. Offered work note but she declines. Declines repeat CXR for now, we will rediscuss on recheck. If patient gets Chest Pain, Shortness of breath or high fever 101.5 she is to seek medical care and or go to the Emergency Department. She declines AVS. She verbalizes understanding and has no questions at discharge.

## 2018-08-05 ENCOUNTER — Ambulatory Visit
Admission: RE | Admit: 2018-08-05 | Discharge: 2018-08-05 | Disposition: A | Discharge status: Home / Self Care | Payer: BLUE CROSS/BLUE SHIELD | Attending: Medical | Admitting: Medical

## 2018-08-05 ENCOUNTER — Ambulatory Visit
Admission: RE | Admit: 2018-08-05 | Discharge: 2018-08-05 | Disposition: A | Discharge status: Home / Self Care | Payer: BLUE CROSS/BLUE SHIELD | Source: Ambulatory Visit | Attending: Medical | Admitting: Medical

## 2018-08-05 ENCOUNTER — Encounter: Payer: Self-pay | Admitting: Medical

## 2018-08-05 ENCOUNTER — Ambulatory Visit: Payer: Self-pay | Admitting: Medical

## 2018-08-05 VITALS — BP 111/71 | HR 87 | Temp 98.6°F | Resp 18

## 2018-08-05 DIAGNOSIS — R0989 Other specified symptoms and signs involving the circulatory and respiratory systems: Secondary | ICD-10-CM | POA: Insufficient documentation

## 2018-08-05 DIAGNOSIS — J181 Lobar pneumonia, unspecified organism: Secondary | ICD-10-CM

## 2018-08-05 DIAGNOSIS — R05 Cough: Secondary | ICD-10-CM

## 2018-08-05 DIAGNOSIS — J189 Pneumonia, unspecified organism: Secondary | ICD-10-CM

## 2018-08-05 DIAGNOSIS — R059 Cough, unspecified: Secondary | ICD-10-CM

## 2018-08-05 MED ORDER — LEVOFLOXACIN 500 MG PO TABS: 500.0000 mg | ORAL_TABLET | Freq: Every day | ORAL | 0 refills | Status: DC

## 2018-08-05 NOTE — Progress Notes (Signed)
Subjective:    Patient ID: Gina Spencer, female    DOB: [birth year], 53 y.o.   MRN: [id number]  HPI 53 yo female in non acute distress returns to clinic for recheck . Diagnosed with viral  Bronchitis  2 days ago.  Prescribed  Steroids, Albuterol MDI and Tessalon Perles.  Feeling better since Monday. Used inhaler at 8am.Productive cough at times , it is clear.  Using humidifer in room. Denies chest pain, does have some shortness of breath in th morning. Denies fever or chills. Over all she feel as if she is improving.  Daughter 49 yo with pneumonia  Diagnosed first week in Jan. Review of Systems  Constitutional: Negative for diaphoresis, fatigue and fever.  HENT: Positive for postnasal drip and rhinorrhea.   Respiratory: Positive for cough (but improving) and shortness of breath (initially with getting up and moving around, better than Monday.). Negative for chest tightness and wheezing.   Cardiovascular: Negative for chest pain.  Musculoskeletal: Negative for myalgias.  Neurological: Negative for dizziness, syncope, light-headedness and headaches.  Blood pressure 111/71, pulse 87, temperature 98.6 F (37 C), temperature source Tympanic, resp. rate 18, SpO2 97 %.    Objective:   Physical Exam Vitals signs and nursing note reviewed.  Constitutional:      Appearance: Normal appearance.  HENT:     Head: Normocephalic and atraumatic.     Jaw: There is normal jaw occlusion.     Right Ear: Hearing, ear canal and external ear normal. A middle ear effusion is present.     Left Ear: Hearing, ear canal and external ear normal. A middle ear effusion is present.     Nose: Congestion present.     Mouth/Throat:     Lips: Pink.     Mouth: Mucous membranes are moist.     Pharynx: Oropharynx is clear. Uvula midline. No pharyngeal swelling, oropharyngeal exudate, posterior oropharyngeal erythema or uvula swelling.     Tonsils: Swelling: 2+ on the right. 2+ on the left.  Eyes:   Extraocular Movements: Extraocular movements intact.     Conjunctiva/sclera: Conjunctivae normal.     Pupils: Pupils are equal, round, and reactive to light.  Neck:     Musculoskeletal: Normal range of motion.  Cardiovascular:     Rate and Rhythm: Normal rate and regular rhythm.     Heart sounds: Normal heart sounds.  Pulmonary:     Effort: Pulmonary effort is normal. No respiratory distress.     Breath sounds: No stridor. Rales present. No wheezing or rhonchi.  Lymphadenopathy:     Cervical: Cervical adenopathy (left side) present.  Skin:    General: Skin is warm and dry.  Neurological:     General: No focal deficit present.     Mental Status: She is alert and oriented to person, place, and time.  Psychiatric:        Mood and Affect: Mood normal.        Behavior: Behavior normal.        Thought Content: Thought content normal.        Judgment: Judgment normal.   rales lower and mid lung fields  cough noted  in room.    Assessmenet Edited primary care provider communication Pneumonia , Cough I Allergies  Allergen Reactions  . Penicillins   Tussionex 37ml p q12 prn cough #30 cc no rx Orders Placed This Encounter  Procedures  . DG Chest 2 View    Standing Status:   Future  Standing Expiration Date:   10/04/2019    Order Specific Question:   Reason for Exam (SYMPTOM  OR DIAGNOSIS REQUIRED)    Answer:   rales on left side ane right base history of influenza then bronchitis    Order Specific Question:   Is patient pregnant?    Answer:   No    Order Specific Question:   Preferred imaging location?    Answer:   [facility]    Order Specific Question:   Call Results- Best Contact Number?    Answer:   [phone]    Order Specific Question:   Radiology Contrast Protocol - do NOT remove file path    Answer:   \\[email or web address]   Cautioned patient on sedation of Tusionex, and not to use both the Tusionex and not to  [employer] Perles together.Work note give for  2 days and to return to clinic in 2 days for a recheck. Patient verbalizes understanding and has no questions at discharge. shortness of breath Called patient with results of CXR, started patient on Levaoquin 500mg  on po qd x 14 days and to have follow up on Friday and then  with me on Monday or Tuesday. To rest , increase fluids.If shortness of breath or chest pain , high fever or any other concerns over the weekend to go to the Emergency Department. She verbalizes understanding and has no questions at discharge.  EXAM: CHEST - 2 VIEW  COMPARISON:  None.  FINDINGS: Cardiac shadows within normal limits. The lungs are well aerated bilaterally. Mild patchy lower lobe infiltrates are noted bilaterally as well as some mild increased interstitial markings. No sizable effusion is seen. No bony abnormality is noted.  IMPRESSION: Patchy interstitial changes with more focal lower lobe infiltrates bilaterally.   Electronically Signed   By: Alcide CleverMark  Lukens M.D.   On: 08/05/2018 10:30

## 2018-08-07 ENCOUNTER — Encounter: Payer: Self-pay | Admitting: Adult Health

## 2018-08-07 ENCOUNTER — Ambulatory Visit: Payer: Self-pay | Admitting: Registered Nurse

## 2018-08-07 VITALS — BP 96/62 | HR 97 | Temp 98.2°F | Resp 16 | Ht 65.0 in | Wt 184.0 lb

## 2018-08-07 DIAGNOSIS — J019 Acute sinusitis, unspecified: Secondary | ICD-10-CM

## 2018-08-07 DIAGNOSIS — N946 Dysmenorrhea, unspecified: Secondary | ICD-10-CM | POA: Insufficient documentation

## 2018-08-07 DIAGNOSIS — F329 Major depressive disorder, single episode, unspecified: Secondary | ICD-10-CM | POA: Insufficient documentation

## 2018-08-07 DIAGNOSIS — K529 Noninfective gastroenteritis and colitis, unspecified: Secondary | ICD-10-CM | POA: Insufficient documentation

## 2018-08-07 DIAGNOSIS — F32A Depression, unspecified: Secondary | ICD-10-CM | POA: Insufficient documentation

## 2018-08-07 DIAGNOSIS — J209 Acute bronchitis, unspecified: Secondary | ICD-10-CM

## 2018-08-07 DIAGNOSIS — Z98891 History of uterine scar from previous surgery: Secondary | ICD-10-CM | POA: Insufficient documentation

## 2018-08-07 DIAGNOSIS — N200 Calculus of kidney: Secondary | ICD-10-CM | POA: Insufficient documentation

## 2018-08-07 MED ORDER — SALINE SPRAY 0.65 % NA SOLN: 2.0000 | NASAL | 0 refills | Status: DC

## 2018-08-07 MED ORDER — FLUTICASONE PROPIONATE 50 MCG/ACT NA SUSP: 1.0000 | Freq: Two times a day (BID) | NASAL | 1 refills | Status: DC

## 2018-08-07 NOTE — Patient Instructions (Signed)
Acute Bronchitis, Adult  Acute bronchitis is sudden (acute) swelling of the air tubes (bronchi) in the lungs. Acute bronchitis causes these tubes to fill with mucus, which can make it hard to breathe. It can also cause coughing or wheezing. In adults, acute bronchitis usually goes away within 2 weeks. A cough caused by bronchitis may last up to 3 weeks. Smoking, allergies, and asthma can make the condition worse. Repeated episodes of bronchitis may cause further lung problems, such as chronic obstructive pulmonary disease (COPD). What are the causes? This condition can be caused by germs and by substances that irritate the lungs, including:  Cold and flu viruses. This condition is most often caused by the same virus that causes a cold.  Bacteria.  Exposure to tobacco smoke, dust, fumes, and air pollution. What increases the risk? This condition is more likely to develop in people who:  Have close contact with someone with acute bronchitis.  Are exposed to lung irritants, such as tobacco smoke, dust, fumes, and vapors.  Have a weak immune system.  Have a respiratory condition such as asthma. What are the signs or symptoms? Symptoms of this condition include:  A cough.  Coughing up clear, yellow, or green mucus.  Wheezing.  Chest congestion.  Shortness of breath.  A fever.  Body aches.  Chills.  A sore throat. How is this diagnosed? This condition is usually diagnosed with a physical exam. During the exam, your health care provider may order tests, such as chest X-rays, to rule out other conditions. He or she may also:  Test a sample of your mucus for bacterial infection.  Check the level of oxygen in your blood. This is done to check for pneumonia.  Do a chest X-ray or lung function testing to rule out pneumonia and other conditions.  Perform blood tests. Your health care provider will also ask about your symptoms and medical history. How is this treated? Most  cases of acute bronchitis clear up over time without treatment. Your health care provider may recommend:  Drinking more fluids. Drinking more makes your mucus thinner, which may make it easier to breathe.  Taking a medicine for a fever or cough.  Taking an antibiotic medicine.  Using an inhaler to help improve shortness of breath and to control a cough.  Using a cool mist vaporizer or humidifier to make it easier to breathe. Follow these instructions at home: Medicines  Take over-the-counter and prescription medicines only as told by your health care provider.  If you were prescribed an antibiotic, take it as told by your health care provider. Do not stop taking the antibiotic even if you start to feel better. General instructions   Get plenty of rest.  Drink enough fluids to keep your urine pale yellow.  Avoid smoking and secondhand smoke. Exposure to cigarette smoke or irritating chemicals will make bronchitis worse. If you smoke and you need help quitting, ask your health care provider. Quitting smoking will help your lungs heal faster.  Use an inhaler, cool mist vaporizer, or humidifier as told by your health care provider.  Keep all follow-up visits as told by your health care provider. This is important. How is this prevented? To lower your risk of getting this condition again:  Wash your hands often with soap and water. If soap and water are not available, use hand sanitizer.  Avoid contact with people who have cold symptoms.  Try not to touch your hands to your mouth, nose, or eyes.    Make sure to get the flu shot every year. Contact a health care provider if:  Your symptoms do not improve in 2 weeks of treatment. Get help right away if:  You cough up blood.  You have chest pain.  You have severe shortness of breath.  You become dehydrated.  You faint or keep feeling like you are going to faint.  You keep vomiting.  You have a severe headache.  Your  fever or chills gets worse. This information is not intended to replace advice given to you by your health care provider. Make sure you discuss any questions you have with your health care provider. Document Released: 08/01/2004 Document Revised: 02/05/2017 Document Reviewed: 12/13/2015 Elsevier Interactive Patient Education  2019 [employer] Nonallergic Rhinitis Nonallergic rhinitis is a condition that causes symptoms that affect the nose, such as a runny nose and a stuffed-up nose (nasal congestion) that can make it hard to breathe through the nose. This condition is different from having an allergy (allergic rhinitis). Allergic rhinitis occurs when the body's defense system (immune system) reacts to a substance that you are allergic to (allergen), such as pollen, pet dander, mold, or dust. Nonallergic rhinitis has many similar symptoms, but it is not caused by allergens. Nonallergic rhinitis can be a short-term or long-term problem. What are the causes? This condition can be caused by many different things. Some common types of nonallergic rhinitis include: Infectious rhinitis  This is usually due to an infection in the upper respiratory tract. Vasomotor rhinitis  This is the most common type of long-term nonallergic rhinitis.  It is caused by too much blood flow through the nose, which makes the tissue inside of the nose swell.  Symptoms are often triggered by strong odors, cold air, stress, drinking alcohol, cigarette smoke, or changes in the weather. Occupational rhinitis  This type is caused by triggers in the workplace, such as chemicals, dusts, animal dander, or air pollution. Hormonal rhinitis  This type occurs in women as a result of an increase in the female hormone estrogen.  It may occur during pregnancy, puberty, and menstrual cycles.  Symptoms improve when estrogen levels drop. Drug-induced rhinitis Several drugs can cause nonallergic rhinitis, including:  Medicines  that are used to treat high blood pressure, heart disease, and Parkinson disease.  Aspirin and NSAIDs.  Over-the-counter nasal decongestant sprays. These can cause a type of nonallergic rhinitis (rhinitis medicamentosa) when they are used for more than a few days. Nonallergic rhinitis with eosinophilia syndrome (NARES)  This type is caused by having too much of a certain type of white blood cell (eosinophil). Nonallergic rhinitis can also be caused by a reaction to eating hot or spicy foods. This does not usually cause long-term symptoms. In some cases, the cause of nonallergic rhinitis is not known. What increases the risk? You are more likely to develop this condition if:  You are 27-9 years of age.  You are a woman. Women are twice as likely to have this condition. What are the signs or symptoms? Common symptoms of this condition include:  Nasal congestion.  Runny nose.  The feeling of mucus going down the back of the throat (postnasal drip).  Trouble sleeping at night and daytime sleepiness. Less common symptoms include:  Sneezing.  Coughing.  Itchy nose.  Bloodshot eyes. How is this diagnosed? This condition may be diagnosed based on:  Your symptoms and medical history.  A physical exam.  Allergy testing to rule out allergic rhinitis. You may have  skin tests or blood tests. In some cases, the health care provider may take a swab of nasal secretions to look for an increased number of eosinophils. This would be done to confirm a diagnosis of NARES. How is this treated? Treatment for this condition depends on the cause. No single treatment works for everyone. Work with your health care provider to find the best treatment for you. Treatment may include:  Avoiding the things that trigger your symptoms.  Using medicines to relieve congestion, such as: ? Steroid nasal spray. There are many types. You may need to try a few to find out which one works  best. ? Decongestant medicine. This may be an oral medicine or a nasal spray. These medicines are only used for a short time.  Using medicines to relieve a runny nose. These may include antihistamine medicines or anticholinergic nasal sprays.  Surgery to remove tissue from inside the nose may be needed in severe cases if the condition has not improved after 6-12 months of medical treatment. Follow these instructions at home:  Take or use over-the-counter and prescription medicines only as told by your health care provider. Do not stop using your medicine even if you start to feel better.  Use salt-water (saline) rinses or other solutions (nasal washes or irrigations) to wash or rinse out the inside of your nose as told by your health care provider.  Do not take NSAIDs or medicines that contain aspirin if they make your symptoms worse.  Do not drink alcohol if it makes your symptoms worse.  Do not use any tobacco products, such as cigarettes, chewing tobacco, and e-cigarettes. If you need help quitting, ask your health care provider.  Avoid secondhand smoke.  Get some exercise every day. Exercise may help reduce symptoms of nonallergic rhinitis for some people. Ask your health care provider how much exercise and what types of exercise are safe for you.  Sleep with the head of your bed raised (elevated). This may reduce nighttime nasal congestion.  Keep all follow-up visits as told by your health care provider. This is important. Contact a health care provider if:  You have a fever.  Your symptoms are getting worse at home.  Your symptoms are not responding to medicine.  You develop new symptoms, especially a headache or nosebleed. This information is not intended to replace advice given to you by your health care provider. Make sure you discuss any questions you have with your health care provider. Document Released: 10/16/2015 Document Revised: 11/30/2015 Document Reviewed:  09/14/2015 Elsevier Interactive Patient Education  2019 [employer] Allergic Rhinitis, Adult Allergic rhinitis is an allergic reaction that affects the mucous membrane inside the nose. It causes sneezing, a runny or stuffy nose, and the feeling of mucus going down the back of the throat (postnasal drip). Allergic rhinitis can be mild to severe. There are two types of allergic rhinitis:  Seasonal. This type is also called hay fever. It happens only during certain seasons.  Perennial. This type can happen at any time of the year. What are the causes? This condition happens when the body's defense system (immune system) responds to certain harmless substances called allergens as though they were germs.  Seasonal allergic rhinitis is triggered by pollen, which can come from grasses, trees, and weeds. Perennial allergic rhinitis may be caused by:  House dust mites.  Pet dander.  Mold spores. What are the signs or symptoms? Symptoms of this condition include:  Sneezing.  Runny or stuffy nose (  nasal congestion).  Postnasal drip.  Itchy nose.  Tearing of the eyes.  Trouble sleeping.  Daytime sleepiness. How is this diagnosed? This condition may be diagnosed based on:  Your medical history.  A physical exam.  Tests to check for related conditions, such as: ? Asthma. ? Pink eye. ? Ear infection. ? Upper respiratory infection.  Tests to find out which allergens trigger your symptoms. These may include skin or blood tests. How is this treated? There is no cure for this condition, but treatment can help control symptoms. Treatment may include:  Taking medicines that block allergy symptoms, such as antihistamines. Medicine may be given as a shot, nasal spray, or pill.  Avoiding the allergen.  Desensitization. This treatment involves getting ongoing shots until your body becomes less sensitive to the allergen. This treatment may be done if other treatments do not  help.  If taking medicine and avoiding the allergen does not work, new, stronger medicines may be prescribed. Follow these instructions at home:  Find out what you are allergic to. Common allergens include smoke, dust, and pollen.  Avoid the things you are allergic to. These are some things you can do to help avoid allergens: ? Replace carpet with wood, tile, or vinyl flooring. Carpet can trap dander and dust. ? Do not smoke. Do not allow smoking in your home. ? Change your heating and air conditioning filter at least once a month. ? During allergy season:  Keep windows closed as much as possible.  Plan outdoor activities when pollen counts are lowest. This is usually during the evening hours.  When coming indoors, change clothing and shower before sitting on furniture or bedding.  Take over-the-counter and prescription medicines only as told by your health care provider.  Keep all follow-up visits as told by your health care provider. This is important. Contact a health care provider if:  You have a fever.  You develop a persistent cough.  You make whistling sounds when you breathe (you wheeze).  Your symptoms interfere with your normal daily activities. Get help right away if:  You have shortness of breath. Summary  This condition can be managed by taking medicines as directed and avoiding allergens.  Contact your health care provider if you develop a persistent cough or fever.  During allergy season, keep windows closed as much as possible. This information is not intended to replace advice given to you by your health care provider. Make sure you discuss any questions you have with your health care provider. Document Released: 03/19/2001 Document Revised: 08/01/2016 Document Reviewed: 08/01/2016 Elsevier Interactive Patient Education  2019 [employer]. How to Perform a Sinus Rinse A sinus rinse is a home treatment that is used to rinse your sinuses with a sterile  mixture of salt and water (saline solution). Sinuses are air-filled spaces in your skull behind the bones of your face and forehead that open into your nasal cavity. A sinus rinse can help to clear mucus, dirt, dust, or pollen from your nasal cavity. You may do a sinus rinse when you have a cold, a virus, nasal allergy symptoms, a sinus infection, or stuffiness in your nose or sinuses. Talk with your health care provider about whether a sinus rinse might help you. What are the risks? A sinus rinse is generally safe and effective. However, there are a few risks, which include:  A burning sensation in your sinuses. This may happen if you do not make the saline solution as directed. Be sure to  follow all directions when making the saline solution.  Nasal irritation.  Infection from contaminated water. This is rare, but possible. Do not do a sinus rinse if you have had ear or nasal surgery, ear infection, or blocked ears. Supplies needed:  Saline solution or powder.  Distilled or sterile water may be needed to mix with saline powder. ? You may use boiled and cooled tap water. Boil tap water for 5 minutes; cool until it is lukewarm. Use within 24 hours. ? Do not use regular tap water to mix with the saline solution.  Neti pot or nasal rinse bottle. These supplies release the saline solution into your nose and through your sinuses. Neti pots and nasal rinse bottles can be purchased at [facility] local [facility], a health food store, or online. How to perform a sinus rinse  1. Wash your hands with soap and water. 2. Wash your device according to the directions that came with the product and then dry it. 3. Use the solution that comes with your product or one that is sold separately in stores. Follow the mixing directions on the package if you need to mix with sterile or distilled water. 4. Fill the device with the amount of saline solution noted in the device instructions. 5. Stand over a sink and tilt  your head sideways over the sink. 6. Place the spout of the device in your upper nostril (the one closer to the ceiling). 7. Gently pour or squeeze the saline solution into your nasal cavity. The liquid should drain out from the lower nostril if you are not too congested. 8. While rinsing, breathe through your open mouth. 9. Gently blow your nose to clear any mucus and rinse solution. Blowing too hard may cause ear pain. 10. Repeat in your other nostril. 11. Clean and rinse your device with clean water and then air-dry it. Talk with your health care provider or pharmacist if you have questions about how to do a sinus rinse. Summary  A sinus rinse is a home treatment that is used to rinse your sinuses with a sterile mixture of salt and water (saline solution).  A sinus rinse is generally safe and effective. Follow all instructions carefully.  Before doing a sinus rinse, talk with your health care provider about whether it would be helpful for you. This information is not intended to replace advice given to you by your health care provider. Make sure you discuss any questions you have with your health care provider. Document Released: 01/19/2014 Document Revised: 04/21/2017 Document Reviewed: 04/21/2017 Elsevier Interactive Patient Education  2019 [employer]

## 2018-08-07 NOTE — Progress Notes (Signed)
Subjective:    Patient ID: Gina Spencer, female    DOB: 1966-05-17, 53 y.o.   MRN: [id number]  52y/o caucasian female established patient last seen 48 hours ago by PA Ratcliffe diagnosed with pneumonia started on levofloxacin and tussionex.  She stated feeling better.  Also still taking prednisone and albuterol prn.  Woke up last night at 0300 had to repeat albuterol MDI last dose was at bedtime.  Has been out of work the past 2 days doesn't feel well enough to go in today.  Doesn't need work note.  [place] [employer], commercial at [employer].  Denied fever/chills/n/v/d.  Has had some rhinitis.  Denied history of spring allergies.  + sick contacts at work.  First office visit 07/27/2018 with PCM at [facility].  "I don't feel fluid in my lungs anymore"  Cough dry  tussionex helping me to sleep it was wonderful.     Review of Systems  Constitutional: Positive for fatigue. Negative for activity change, appetite change, chills, diaphoresis, fever and unexpected weight change.  HENT: Positive for congestion and rhinorrhea. Negative for dental problem, drooling, ear discharge, ear pain, facial swelling, hearing loss, mouth sores, nosebleeds, postnasal drip, sinus pressure, sinus pain, sneezing, sore throat, tinnitus, trouble swallowing and voice change.   Eyes: Negative for photophobia, pain, discharge, redness, itching and visual disturbance.  Respiratory: Positive for cough. Negative for choking, chest tightness, shortness of breath, wheezing and stridor.   Cardiovascular: Negative for chest pain, palpitations and leg swelling.  Gastrointestinal: Negative for abdominal pain, diarrhea, nausea and vomiting.  Endocrine: Negative for cold intolerance and heat intolerance.  Genitourinary: Negative for difficulty urinating, dysuria and hematuria.  Musculoskeletal: Negative for arthralgias, back pain, gait problem, joint swelling, myalgias, neck pain and neck stiffness.  Skin: Negative for color change, pallor,  rash and wound.  Allergic/Immunologic: Negative for environmental allergies and food allergies.  Neurological: Negative for dizziness, tremors, seizures, syncope, facial asymmetry, speech difficulty, weakness, light-headedness, numbness and headaches.  Hematological: Negative for adenopathy. Does not bruise/bleed easily.  Psychiatric/Behavioral: Negative for agitation, behavioral problems, confusion and sleep disturbance.       Objective:   Physical Exam Vitals signs and nursing note reviewed.  Constitutional:      General: She is awake. She is not in acute distress.    Appearance: Normal appearance. She is well-developed and well-groomed. She is ill-appearing. She is not toxic-appearing or diaphoretic.  HENT:     Head: Normocephalic and atraumatic.     Jaw: There is normal jaw occlusion. No trismus.     Right Ear: Hearing, ear canal and external ear normal. A middle ear effusion is present. There is no impacted cerumen.     Left Ear: Hearing, ear canal and external ear normal. A middle ear effusion is present. There is no impacted cerumen.     Nose: Mucosal edema, congestion and rhinorrhea present. No nasal deformity, septal deviation or laceration.     Right Turbinates: Enlarged and swollen. Not pale.     Left Turbinates: Enlarged and swollen. Not pale.     Right Sinus: No maxillary sinus tenderness or frontal sinus tenderness.     Left Sinus: No maxillary sinus tenderness or frontal sinus tenderness.     Mouth/Throat:     Lips: Pink. No lesions.     Mouth: Mucous membranes are moist. Mucous membranes are not pale, not dry and not cyanotic. No lacerations, oral lesions or angioedema.     Dentition: Normal dentition. Does not have dentures. No  dental caries or dental abscesses.     Pharynx: Uvula midline. Pharyngeal swelling and posterior oropharyngeal erythema present. No oropharyngeal exudate or uvula swelling.     Tonsils: No tonsillar exudate or tonsillar abscesses. Swelling: 1+ on  the right. 1+ on the left.     Comments: Bilateral TMs air fluid level 25% opacity; cobblestoning posterior pharynx; 1+tonsils edema/erythema no exudate; bilateral allergic shiners; clear discharge bilateral nasal turbinates edema/erythema Eyes:     General: Lids are normal. Allergic shiner present. No visual field deficit or scleral icterus.       Right eye: No foreign body, discharge or hordeolum.        Left eye: No foreign body, discharge or hordeolum.     Extraocular Movements: Extraocular movements intact.     Right eye: Normal extraocular motion and no nystagmus.     Left eye: Normal extraocular motion and no nystagmus.     Conjunctiva/sclera: Conjunctivae normal.     Right eye: Right conjunctiva is not injected. No chemosis, exudate or hemorrhage.    Left eye: Left conjunctiva is not injected. No chemosis, exudate or hemorrhage.    Pupils: Pupils are equal, round, and reactive to light. Pupils are equal.     Right eye: Pupil is round and reactive.     Left eye: Pupil is round and reactive.  Neck:     Musculoskeletal: Normal range of motion and neck supple. Normal range of motion. No edema, erythema, neck rigidity or muscular tenderness.     Thyroid: No thyroid mass or thyromegaly.     Trachea: Trachea normal. No tracheal tenderness or tracheal deviation.  Cardiovascular:     Rate and Rhythm: Normal rate and regular rhythm.     Chest Wall: PMI is not displaced.     Heart sounds: Normal heart sounds, S1 normal and S2 normal. No murmur. No friction rub. No gallop.   Pulmonary:     Effort: Pulmonary effort is normal. No accessory muscle usage or respiratory distress.     Breath sounds: Normal breath sounds and air entry. No stridor, decreased air movement or transmitted upper airway sounds. No decreased breath sounds, wheezing, rhonchi or rales.     Comments: No cough observed in exam room; spoke full sentences without difficulty Chest:     Chest wall: No tenderness.  Abdominal:      General: Abdomen is flat. There is no distension.     Palpations: Abdomen is soft.  Musculoskeletal: Normal range of motion.        General: No tenderness.     Right shoulder: Normal.     Left shoulder: Normal.     Right elbow: Normal.    Left elbow: Normal.     Right hip: Normal.     Left hip: Normal.     Right knee: Normal.     Left knee: Normal.     Cervical back: Normal.     Thoracic back: Normal.     Lumbar back: Normal.     Right hand: Normal.     Left hand: Normal.     Right lower leg: No edema.     Left lower leg: No edema.  Lymphadenopathy:     Head:     Right side of head: No submental, submandibular, tonsillar, preauricular, posterior auricular or occipital adenopathy.     Left side of head: No submental, submandibular, tonsillar, preauricular, posterior auricular or occipital adenopathy.     Cervical: No cervical adenopathy.  Right cervical: No superficial, deep or posterior cervical adenopathy.    Left cervical: No superficial, deep or posterior cervical adenopathy.  Skin:    General: Skin is warm and dry.     Capillary Refill: Capillary refill takes less than 2 seconds.     Coloration: Skin is not ashen, cyanotic, jaundiced, mottled, pale or sallow.     Findings: No abrasion, abscess, acne, bruising, burn, ecchymosis, erythema, signs of injury, laceration, lesion, petechiae, rash or wound.     Nails: There is no clubbing.   Neurological:     General: No focal deficit present.     Mental Status: She is alert and oriented to person, place, and time. Mental status is at baseline. She is not disoriented.     GCS: GCS eye subscore is 4. GCS verbal subscore is 5. GCS motor subscore is 6.     Cranial Nerves: Cranial nerves are intact. No cranial nerve deficit, dysarthria or facial asymmetry.     Sensory: Sensation is intact. No sensory deficit.     Motor: Motor function is intact. No weakness, tremor, atrophy, abnormal muscle tone or seizure activity.      Coordination: Coordination is intact. Coordination normal.     Gait: Gait is intact. Gait normal.     Comments: On/off exam table without difficulty; gait sure and steady in hallway  Psychiatric:        Attention and Perception: Attention and perception normal.        Mood and Affect: Mood and affect normal.        Speech: Speech normal.        Behavior: Behavior normal. Behavior is cooperative.        Thought Content: Thought content normal.        Cognition and Memory: Cognition and memory normal.        Judgment: Judgment normal.           Assessment & Plan:  A-acute bronchitis and rhinosinusitis  P-Pneumonia on xray 08/05/2018 saw PA Ratcliffe started on levofloxacin daily. Recommend staying home today and resting as still fatigued not sleeping through the night due to symptoms.  Cough lozenges po q2h prn cough  May continue tessalon pearles po TID prn cough and tussionex 5ml po qhs prn cough at home.  Discussed if needs refill contact PA Ratcliffe next week.  Finish  Prednisone po daily with breakfast at home  Discussed possible side effects increased/decreased appetite, difficulty sleeping, increased blood sugar, increased blood pressure and heart rate.  Albuterol MDI 1-2 puffs po q4-6h prn protracted cough/wheeze  side effect increased heart rate/hand tremors.  Discussed cold air can precipitate cough consider mask/scarf when outside in ams.  Recommended stay inside as much as possible. Bronchitis simple, community acquired, may have started as viral (probably respiratory syncytial, parainfluenza, influenza, or adenovirus), but now evidence of acute purulent bronchitis with resultant bronchial edema and mucus formation.  Viruses are the most common cause of bronchial inflammation in otherwise healthy adults with acute bronchitis.  The appearance of sputum is not predictive of whether a bacterial infection is present.  Purulent sputum is most often caused by viral infections.  There  are a small portion of those caused by non-viral agents being Mycoplama pneumonia.  Microscopic examination or C&S of sputum in the healthy adult with acute bronchitis is generally not helpful (usually negative or normal respiratory flora) other considerations being cough from upper respiratory tract infections, sinusitis or allergic syndromes (mild asthma or viral  pneumonia).  Differential Diagnoses:  reactive airway disease (asthma, allergic aspergillosis (eosinophilia), chronic bronchitis, respiratory infection (sinusitis, common cold, pneumonia), congestive heart failure, reflux esophagitis, bronchogenic tumor, aspiration syndromes and/or exposure to pulmonary irritants/smoke.   Without high fever, severe dyspnea, lack of physical findings or other risk factors, I will hold on a chest radiograph and CBC at this time.  I discussed that approximately 50% of patients with acute bronchitis have a cough that lasts up to three weeks, and 25% for over a month.  Tylenol 500mg  one to two tablets every four to six hours as needed for fever or myalgias.  No aspirin. Exitcare handout on bronchitis and inhaler use.  ER if hemopthysis, SOB, worst chest pain of life.   Patient instructed to follow up Monday with PA Ratcliffe if not feeling better or new/worsening symptoms Patient verbalized agreement and understanding of treatment plan and had no further questions at this time.  P2:  hand washing and cover cough  Patient may use normal saline nasal spray 2 sprays each nostril q2h wa as needed. flonase 50mcg 1 spray each nostril BID #1 RF1 electronic rx to her pharmacy of choice  Discussed post nasal drip irritating throat and rhinitis occurs in cold weather and from virus/allergies/bacterial infections.  Flonase covers for all 3 scenerios and does not cause drowsiness.  Discussed administration light sniff shouldn't taste in throat if yes repeat with lighter sniff and use saline first.  Patient denied personal or family  history of ENT cancer.  Consider OTC antihistamine of choice claritin/zyrtec 10mg  po daily if still having rhinitis as spring bloom started early this year.  Avoid triggers if possible.  Shower prior to bedtime if exposed to triggers.  If allergic dust/dust mites recommend mattress/pillow covers/encasements; washing linens, vacuuming, sweeping, dusting weekly.  Call or return to clinic as needed if these symptoms worsen or fail to improve as anticipated.   Exitcare handout on nonallergic and allergic rhinitis and sinus rinse.  Patient verbalized understanding of instructions, agreed with plan of care and had no further questions at this time.  P2:  Avoidance and hand washing.

## 2018-08-10 ENCOUNTER — Encounter: Payer: Self-pay | Admitting: Medical

## 2018-08-10 ENCOUNTER — Ambulatory Visit: Payer: Self-pay | Admitting: Medical

## 2018-08-10 VITALS — BP 122/74 | HR 108 | Temp 99.0°F | Resp 18

## 2018-08-10 DIAGNOSIS — J189 Pneumonia, unspecified organism: Secondary | ICD-10-CM

## 2018-08-10 NOTE — Progress Notes (Signed)
   Subjective:    Patient ID: Gina Spencer, female    DOB: 1966-02-12, 54 y.o.   MRN: [id number]  HPI 53 yo female in non acute distress.  Still coughing not using inhaler or cough pills, just Tussionex at night. Waking up a little lightheaded and today she could not remember if she brushed her teeth.  Fatiigue and still some shortness of breath but improving.  Finisded prednisone , taking Levaquin as prescribed.  States her supervisor just wants a note when she can return to work.  .Blood pressure 122/74, pulse (!) 108, temperature 99 F (37.2 C), temperature source Tympanic, resp. rate 18, SpO2 97 %. Allergies  Allergen Reactions  . Penicillins       Review of Systems  Constitutional: Positive for fatigue. Negative for chills and fever.  HENT: Positive for postnasal drip and rhinorrhea (a tiny bit per patient). Negative for sinus pressure, sinus pain, sneezing, sore throat and trouble swallowing.   Eyes: Negative for discharge and itching.  Respiratory: Positive for cough and shortness of breath (but better).   Cardiovascular: Negative for chest pain.  Gastrointestinal: Negative for abdominal pain, diarrhea, nausea and vomiting.  Endocrine: Negative for polydipsia, polyphagia and polyuria.  Genitourinary: Negative for dysuria.  Musculoskeletal: Negative for myalgias.  Skin: Negative for rash.  Neurological: Positive for light-headedness (sometimes in the mornings). Negative for dizziness and syncope.  Psychiatric/Behavioral: Negative for agitation, confusion, decreased concentration, self-injury and suicidal ideas.       Objective:   Physical Exam Vitals signs reviewed.  Constitutional:      Appearance: Normal appearance.  HENT:     Head: Normocephalic and atraumatic.     Mouth/Throat:     Mouth: Mucous membranes are moist.  Eyes:     Extraocular Movements: Extraocular movements intact.     Pupils: Pupils are equal, round, and reactive to light.  Neck:   Musculoskeletal: Normal range of motion.  Cardiovascular:     Rate and Rhythm: Tachycardia present.     Heart sounds: Normal heart sounds.  Pulmonary:     Effort: Pulmonary effort is normal.     Breath sounds: Rales (mild on left side mid) present.  Musculoskeletal: Normal range of motion.  Skin:    General: Skin is warm and dry.  Neurological:     General: No focal deficit present.     Mental Status: She is alert and oriented to person, place, and time.  Psychiatric:        Mood and Affect: Mood normal.        Behavior: Behavior normal.        Thought Content: Thought content normal.        Judgment: Judgment normal.     Coughing in room, had not taken cough medication or Albuterol inhaler today.      Assessment & Plan:  Pneumonia  Cough Refill on Tussionex 1/2 -1 tsp every 12 hours as needed for cough #30 mls Return in 3 days for recheck Patient verbalizes understandidng and has no questions at discharge.

## 2018-08-10 NOTE — Patient Instructions (Signed)
Community-Acquired Pneumonia, Adult  Pneumonia is an infection of the lungs. It causes swelling in the airways of the lungs. Mucus and fluid may also build up inside the airways.  One type of pneumonia can happen while a person is in a hospital. A different type can happen when a person is not in a hospital (community-acquired pneumonia).   What are the causes?    This condition is caused by germs (viruses, bacteria, or fungi). Some types of germs can be passed from one person to another. This can happen when you breathe in droplets from the cough or sneeze of an infected person.  What increases the risk?  You are more likely to develop this condition if you:   Have a long-term (chronic) disease, such as:  ? Chronic obstructive pulmonary disease (COPD).  ? Asthma.  ? Cystic fibrosis.  ? Congestive heart failure.  ? Diabetes.  ? Kidney disease.   Have HIV.   Have sickle cell disease.   Have had your spleen removed.   Do not take good care of your teeth and mouth (poor dental hygiene).   Have a medical condition that increases the risk of breathing in droplets from your own mouth and nose.   Have a weakened body defense system (immune system).   Are a smoker.   Travel to areas where the germs that cause this illness are common.   Are around certain animals or the places they live.  What are the signs or symptoms?   A dry cough.   A wet (productive) cough.   Fever.   Sweating.   Chest pain. This often happens when breathing deeply or coughing.   Fast breathing or trouble breathing.   Shortness of breath.   Shaking chills.   Feeling tired (fatigue).   Muscle aches.  How is this treated?  Treatment for this condition depends on many things. Most adults can be treated at home. In some cases, treatment must happen in a hospital. Treatment may include:   Medicines given by mouth or through an IV tube.   Being given extra oxygen.   Respiratory therapy.  In rare cases, treatment for very bad pneumonia  may include:   Using a machine to help you breathe.   Having a procedure to remove fluid from around your lungs.  Follow these instructions at home:  Medicines   Take over-the-counter and prescription medicines only as told by your doctor.  ? Only take cough medicine if you are losing sleep.   If you were prescribed an antibiotic medicine, take it as told by your doctor. Do not stop taking the antibiotic even if you start to feel better.  General instructions     Sleep with your head and neck raised (elevated). You can do this by sleeping in a recliner or by putting a few pillows under your head.   Rest as needed. Get at least 8 hours of sleep each night.   Drink enough water to keep your pee (urine) pale yellow.   Eat a healthy diet that includes plenty of vegetables, fruits, whole grains, low-fat dairy products, and lean protein.   Do not use any products that contain nicotine or tobacco. These include cigarettes, e-cigarettes, and chewing tobacco. If you need help quitting, ask your doctor.   Keep all follow-up visits as told by your doctor. This is important.  How is this prevented?  A shot (vaccine) can help prevent pneumonia. Shots are often suggested for:   People   older than 55 years of age.   People older than 55 years of age who:  ? Are having cancer treatment.  ? Have long-term (chronic) lung disease.  ? Have problems with their body's defense system.  You may also prevent pneumonia if you take these actions:   Get the flu (influenza) shot every year.   Go to the dentist as often as told.   Wash your hands often. If you cannot use soap and water, use hand sanitizer.  Contact a doctor if:   You have a fever.   You lose sleep because your cough medicine does not help.  Get help right away if:   You are short of breath and it gets worse.   You have more chest pain.   Your sickness gets worse. This is very serious if:  ? You are an older adult.  ? Your body's defense system is weak.   You  cough up blood.  Summary   Pneumonia is an infection of the lungs.   Most adults can be treated at home. Some will need treatment in a hospital.   Drink enough water to keep your pee pale yellow.   Get at least 8 hours of sleep each night.  This information is not intended to replace advice given to you by your health care provider. Make sure you discuss any questions you have with your health care provider.  Document Released: 12/11/2007 Document Revised: 02/19/2018 Document Reviewed: 02/19/2018  Elsevier Interactive Patient Education  2019 [employer]

## 2018-08-11 ENCOUNTER — Encounter: Payer: Self-pay | Admitting: Medical

## 2018-08-12 ENCOUNTER — Encounter: Payer: Self-pay | Admitting: Medical

## 2018-08-12 ENCOUNTER — Ambulatory Visit: Payer: Self-pay | Admitting: Medical

## 2018-08-12 VITALS — BP 116/73 | HR 100 | Temp 98.5°F | Resp 18

## 2018-08-12 DIAGNOSIS — J181 Lobar pneumonia, unspecified organism: Principal | ICD-10-CM

## 2018-08-12 DIAGNOSIS — J189 Pneumonia, unspecified organism: Secondary | ICD-10-CM

## 2018-08-12 DIAGNOSIS — M79672 Pain in left foot: Secondary | ICD-10-CM

## 2018-08-12 MED ORDER — BENZONATATE 100 MG PO CAPS: 100.0000 mg | ORAL_CAPSULE | Freq: Three times a day (TID) | ORAL | 0 refills | Status: DC | PRN

## 2018-08-12 MED ORDER — DOXYCYCLINE HYCLATE 100 MG PO TABS: 100.0000 mg | ORAL_TABLET | Freq: Two times a day (BID) | ORAL | 0 refills | Status: DC

## 2018-08-12 NOTE — Progress Notes (Signed)
Subjective:    Patient ID: Gina Spencer, female    DOB: 10-Dec-1965, 53 y.o.   MRN: [id number]  HPI 53 yo female in non acute distress.  Left foot started Tuesday morning , going up and down steps 2 times on Tuesday. Yesterday with pain going downstairs for dinner. Pain is located  Left plantar surface mid foot.  Took 400mg  of Ibuprofen at  8am. Day 6 of Levaquin. Hx of fasia release surgery in 2016 by   Dr. Charlsie Merlesegal.   Blood pressure 116/73, pulse 100, temperature 98.5 F (36.9 C), temperature source Tympanic, resp. rate 18, SpO2 98 %. Allergies  Allergen Reactions  . Levaquin [Levofloxacin]     History of tendonitis in left foot.  Marland Kitchen. Penicillins      Review of Systems  Constitutional: Negative for chills and fever.  HENT: Negative for ear pain and sore throat.   Respiratory: Positive for cough (mild) and shortness of breath (sometimes with exertion).   Gastrointestinal: Negative for abdominal pain.  Genitourinary: Negative for dysuria.  Musculoskeletal: Negative for myalgias.  Skin: Negative for rash.  Neurological: Negative for dizziness, syncope and light-headedness.  Psychiatric/Behavioral: Negative for behavioral problems, self-injury and suicidal ideas.       Objective:   Physical Exam Vitals signs and nursing note reviewed.  Constitutional:      Appearance: Normal appearance.  HENT:     Head: Normocephalic and atraumatic.     Mouth/Throat:     Mouth: Mucous membranes are moist.  Eyes:     Extraocular Movements: Extraocular movements intact.     Conjunctiva/sclera: Conjunctivae normal.     Pupils: Pupils are equal, round, and reactive to light.  Neck:     Musculoskeletal: Normal range of motion.  Cardiovascular:     Rate and Rhythm: Normal rate and regular rhythm.     Heart sounds: Normal heart sounds.  Pulmonary:     Effort: Pulmonary effort is normal. No respiratory distress.     Breath sounds: Normal breath sounds. No stridor. No wheezing,  rhonchi or rales.  Musculoskeletal:        General: Tenderness (left arch,  2+ PT/DP) present.  Skin:    General: Skin is warm and dry.  Neurological:     General: No focal deficit present.     Mental Status: She is alert and oriented to person, place, and time.  Psychiatric:        Mood and Affect: Mood normal.        Behavior: Behavior normal.        Thought Content: Thought content normal.        Judgment: Judgment normal.           Assessment & Plan:  Pneumonia bilateral lungs Left plantar surface foot pain possible from Levaquin.. Stop Levaquin due to most likely tendonitis of the plantar surface of  left foot. Meds ordered this encounter  Medications  . doxycycline (VIBRA-TABS) 100 MG tablet    Sig: Take 1 tablet (100 mg total) by mouth 2 (two) times daily.    Dispense:  20 tablet    Refill:  0  . DISCONTD: benzonatate (TESSALON PERLES) 100 MG capsule    Sig: Take 1 capsule (100 mg total) by mouth 3 (three) times daily as needed.    Dispense:  30 capsule    Refill:  0   One week follow up for left foot pain and recheck of lungs..   Patient verbalizes understanding and has no questions  at discharge.

## 2018-08-19 ENCOUNTER — Ambulatory Visit: Payer: Self-pay | Admitting: Nurse Practitioner

## 2018-08-19 ENCOUNTER — Ambulatory Visit: Payer: Self-pay | Admitting: Medical

## 2018-08-19 ENCOUNTER — Encounter: Payer: Self-pay | Admitting: Nurse Practitioner

## 2018-08-19 VITALS — BP 113/69 | HR 80 | Temp 99.1°F | Resp 18 | Wt 187.0 lb

## 2018-08-19 DIAGNOSIS — M779 Enthesopathy, unspecified: Secondary | ICD-10-CM

## 2018-08-19 NOTE — Progress Notes (Signed)
   Subjective:    Patient ID: Gina Spencer, female    DOB: [birth year], 53 y.o.   MRN: [id number]  HPI Gina Spencer is here today for follow up. She reports her respiratory symptoms is much improved; which she was found to have PNA. She developed tendonitis from the quinolone and was changed to doxycyline. She has 3 more days of abx therapy left and takes tessalon perrles PRN for dx of PNA. Continues inhaler as needed. She reports tendonitis is much improved and doesn't need to take ibuprofen for this; which she stopped taking 4 days ago. She denies any new or worsening respiratory or MSK complaints today    Review of Systems  Respiratory: Positive for cough.        Reports SOB with activity  Musculoskeletal: Positive for myalgias. Negative for gait problem and joint swelling.       Left foot pain but improving       Objective:   Physical Exam Vitals signs reviewed.  Constitutional:      Appearance: Normal appearance. She is well-developed.  HENT:     Head: Normocephalic and atraumatic.     Comments: No frontal or maxillary sinus tendernesss    Right Ear: Ear canal normal.     Left Ear: Ear canal normal.     Ears:     Comments: Left TM with clear serous fluid    Nose: Nose normal.     Mouth/Throat:     Mouth: Mucous membranes are moist.     Comments: pharnyx mildly injected Neck:     Musculoskeletal: Normal range of motion and neck supple.  Cardiovascular:     Rate and Rhythm: Normal rate and regular rhythm.     Heart sounds: Normal heart sounds.     Comments: Left pedal pulse 2+ Pulmonary:     Effort: Pulmonary effort is normal. No respiratory distress.     Breath sounds: Normal breath sounds. No wheezing or rhonchi.     Comments: Nonproductive cough on exam with deep inpsiration Abdominal:     General: Bowel sounds are normal.     Palpations: Abdomen is soft.  Musculoskeletal: Normal range of motion.        General: No swelling, tenderness or deformity.     Left  lower leg: No edema.     Comments: Left foots with 5/5 strength, no edema, erythema or tenderness on palpation. Some pain elicited with plantar flexion but full ROM noted on exam  Skin:    General: Skin is warm and dry.     Findings: No erythema.  Neurological:     Mental Status: She is alert and oriented to person, place, and time.  Psychiatric:        Mood and Affect: Mood normal.           Assessment & Plan:

## 2018-08-19 NOTE — Patient Instructions (Addendum)
Resolving pneumonia and tendonitis of the left foot Fluids, rest and handwashing Continue antibiotic as directed Start strectching, and heat the left foot Continue tessalon, flonase, and inhaler as needed Add Claritin to your current regimen If no improvement or resolution of symptoms return to clinic; discussed would need to consider steroid for tendonitis if not resolved; then proceed to ortho referral. Thyra Breed is in agreeance with plan today

## 2018-08-19 NOTE — Progress Notes (Signed)
d 

## 2018-09-07 ENCOUNTER — Encounter: Payer: Self-pay | Admitting: Medical

## 2018-09-07 ENCOUNTER — Ambulatory Visit: Payer: Self-pay | Admitting: Medical

## 2018-09-07 ENCOUNTER — Ambulatory Visit
Admission: RE | Admit: 2018-09-07 | Discharge: 2018-09-07 | Disposition: A | Discharge status: Home / Self Care | Payer: BLUE CROSS/BLUE SHIELD | Attending: Medical | Admitting: Medical

## 2018-09-07 ENCOUNTER — Ambulatory Visit
Admission: RE | Admit: 2018-09-07 | Discharge: 2018-09-07 | Disposition: A | Discharge status: Home / Self Care | Payer: BLUE CROSS/BLUE SHIELD | Source: Ambulatory Visit | Attending: Medical | Admitting: Medical

## 2018-09-07 ENCOUNTER — Other Ambulatory Visit: Payer: Self-pay

## 2018-09-07 VITALS — BP 109/67 | HR 94 | Temp 98.7°F | Resp 18 | Wt 187.2 lb

## 2018-09-07 DIAGNOSIS — R0982 Postnasal drip: Secondary | ICD-10-CM

## 2018-09-07 DIAGNOSIS — J189 Pneumonia, unspecified organism: Secondary | ICD-10-CM

## 2018-09-07 DIAGNOSIS — R0602 Shortness of breath: Secondary | ICD-10-CM

## 2018-09-07 DIAGNOSIS — H6502 Acute serous otitis media, left ear: Secondary | ICD-10-CM

## 2018-09-07 DIAGNOSIS — R0989 Other specified symptoms and signs involving the circulatory and respiratory systems: Secondary | ICD-10-CM

## 2018-09-07 DIAGNOSIS — J181 Lobar pneumonia, unspecified organism: Secondary | ICD-10-CM

## 2018-09-07 DIAGNOSIS — R062 Wheezing: Secondary | ICD-10-CM

## 2018-09-07 MED ORDER — FLUTICASONE-SALMETEROL 100-50 MCG/DOSE IN AEPB: 1.0000 | INHALATION_SPRAY | Freq: Two times a day (BID) | RESPIRATORY_TRACT | 1 refills | Status: DC

## 2018-09-07 MED ORDER — FLUTICASONE-SALMETEROL 100-50 MCG/DOSE IN AEPB: 1.0000 | INHALATION_SPRAY | Freq: Two times a day (BID) | RESPIRATORY_TRACT | 3 refills | Status: DC

## 2018-09-07 MED ORDER — AZITHROMYCIN 250 MG PO TABS: ORAL_TABLET | ORAL | 0 refills | Status: DC

## 2018-09-07 NOTE — Progress Notes (Signed)
Subjective:    Patient ID: Gina Spencer, female    DOB: September 04, 1965, 53 y.o.   MRN: [id number]  HPI 53 yo female in non acute distress. Started on Friday cough non productive began and nasal discharge clear. Daughter 65 yo  is  Also ill " I am not sure if it is allergies or a cold with her.". Restarted Benzonatate capsules for cough.  Mucinex/Phenylephrine Twice daily though it is a 4 hour medication.  Still has Albuterol  inhaler , but has not had to use it.  Ibuprofen  400 mg  At 7 am. Using her Flonase, tried Loratadine for  2-3 days did not notice much difference.   Recently treated for Flu and then bilateral pneumonia was on Albuterol, Prednisone taper and Doxy for 10 days, did improve and felt well x 7 days .  Blood pressure 109/67, pulse 94, temperature 98.7 F (37.1 C), temperature source Tympanic, resp. rate 18, weight 187 lb 3.2 oz (84.9 kg), SpO2 99 %. Allergies  Allergen Reactions  . Levaquin [Levofloxacin]     History of tendonitis in left foot.  Marland Kitchen Penicillins        Review of Systems  Constitutional: Positive for fatigue (alittle). Negative for chills and fever.  HENT: Positive for congestion, ear pain (left ear "a couple of times i have flat pian"), postnasal drip, rhinorrhea, sinus pressure (maxillary bilateral), sneezing and voice change (deeper). Negative for sore throat.   Eyes: Negative for discharge and itching.  Respiratory: Positive for cough, shortness of breath ("a little") and wheezing. Negative for chest tightness.   Cardiovascular: Negative for chest pain, palpitations and leg swelling.  Gastrointestinal: Negative for abdominal pain, diarrhea, nausea and vomiting.  Genitourinary: Negative for dysuria.  Skin: Negative for rash.  Allergic/Immunologic: Positive for environmental allergies. Negative for food allergies.  Neurological: Positive for headaches (left frontal). Negative for dizziness, syncope and light-headedness.  Hematological:  Negative for adenopathy.  Psychiatric/Behavioral: Negative for behavioral problems, confusion, self-injury and suicidal ideas.   Last week" not crackling at all"     Objective:   Physical Exam Vitals signs and nursing note reviewed.  Constitutional:      Appearance: Normal appearance. She is normal weight.  HENT:     Head: Normocephalic and atraumatic.     Right Ear: Tympanic membrane, ear canal and external ear normal.     Left Ear: Ear canal and external ear normal.     Nose: Congestion and rhinorrhea (clear) present.     Mouth/Throat:     Mouth: Mucous membranes are moist.     Pharynx: Oropharynx is clear. No oropharyngeal exudate or posterior oropharyngeal erythema.     Comments: Post nasal drip Eyes:     Extraocular Movements: Extraocular movements intact.     Conjunctiva/sclera: Conjunctivae normal.     Pupils: Pupils are equal, round, and reactive to light.  Cardiovascular:     Rate and Rhythm: Normal rate and regular rhythm.     Heart sounds: Normal heart sounds.  Pulmonary:     Effort: Pulmonary effort is normal.     Breath sounds: Rales present.  Musculoskeletal: Normal range of motion.  Lymphadenopathy:     Cervical: No cervical adenopathy.  Skin:    General: Skin is warm and dry.     Capillary Refill: Capillary refill takes less than 2 seconds.  Neurological:     General: No focal deficit present.     Mental Status: She is alert and oriented to person, place,  and time.  Psychiatric:        Mood and Affect: Mood normal.        Behavior: Behavior normal.        Thought Content: Thought content normal.        Judgment: Judgment normal.   clearing throat in room, alot  Sounds wheezing upper airway , none in lungs   rales right base Assessment & Plan:  Otitis Media left, Post nasal drip Rales left lower base Upper respiratory wheezing with laughing, Anterior only.  Continue Flonase and restart Loratadine per package instructions. Meds ordered this  encounter  Medications  . azithromycin (ZITHROMAX) 250 MG tablet    Sig: Take 2 tablets by mouth today then one tablet day 2-5. Take with food.    Dispense:  6 tablet    Refill:  0  . DISCONTD: Fluticasone-Salmeterol (ADVAIR) 100-50 MCG/DOSE AEPB    Sig: Inhale 1 puff into the lungs 2 (two) times daily.    Dispense:  1 each    Refill:  3  . Fluticasone-Salmeterol (WIXELA INHUB) 100-50 MCG/DOSE AEPB    Sig: Inhale 1 puff into the lungs 2 (two) times daily.    Dispense:  60 each    Refill:  1   per [employer] Orders Placed This Encounter  Procedures  . DG Chest 2 View    Order Specific Question:   Reason for Exam (SYMPTOM  OR DIAGNOSIS REQUIRED)    Answer:   crackles left base of lungs    Order Specific Question:   Is patient pregnant?    Answer:   No    Order Specific Question:   Preferred imaging location?    Answer:   [facility]    Order Specific Question:   Call Results- Best Contact Number?    Answer:   [phone] [facility]    Order Specific Question:   Radiology Contrast Protocol - do NOT remove file path    Answer:   [email or web address]  Will call patient with chest x-ray results. Called patinet about 5 pm with results of CXR. EXAM: CHEST - 2 VIEW  COMPARISON:  08/05/2018  FINDINGS: Ovoid opacity along the right major fissure which may reflect loculated fluid within the fissure versus a soft tissue mass abutting the fissure. There is no other focal parenchymal opacity. There is no pleural effusion or pneumothorax. The heart and mediastinal contours are unremarkable. WNL except elevated Glucose, these are not gasting labs. Bun /Cr ratio =26 (normal is 9-23).  The osseous structures are unremarkable.  IMPRESSION: Ovoid opacity along the right major fissure which may reflect loculated fluid within the fissure versus a soft tissue mass abutting the fissure. Recommend further evaluation with a CT of  the chest.   Electronically Signed   By: Elige Ko   On: 09/07/2018 12:11   Reviewed with Dr, Sullivan Lone agrees on plan to get CT and blood work.   understanding and has no questions at discharge.Called patient at  8 pm and reviewed  lab work . Scheduled  CT Scan of chest with Contrast 09/08/2018.

## 2018-09-07 NOTE — Patient Instructions (Signed)

## 2018-09-08 ENCOUNTER — Encounter: Payer: Self-pay | Admitting: *Deleted

## 2018-09-08 ENCOUNTER — Other Ambulatory Visit: Payer: Self-pay | Admitting: Medical

## 2018-09-08 ENCOUNTER — Ambulatory Visit
Admission: RE | Admit: 2018-09-08 | Discharge: 2018-09-08 | Disposition: A | Discharge status: Home / Self Care | Payer: BLUE CROSS/BLUE SHIELD | Source: Ambulatory Visit | Attending: Medical | Admitting: Medical

## 2018-09-08 ENCOUNTER — Encounter: Payer: Self-pay | Admitting: Medical

## 2018-09-08 DIAGNOSIS — R0602 Shortness of breath: Secondary | ICD-10-CM

## 2018-09-08 MED ORDER — IOPAMIDOL (ISOVUE-300) INJECTION 61%
75.0000 mL | Freq: Once | INTRAVENOUS | Status: AC | PRN
  Administered 2018-09-08: 75 mL via INTRAVENOUS

## 2018-09-09 ENCOUNTER — Encounter: Payer: Self-pay | Admitting: Medical

## 2018-09-09 ENCOUNTER — Telehealth: Payer: Self-pay | Admitting: Primary Care

## 2018-09-09 LAB — COMPREHENSIVE METABOLIC PANEL
ALK PHOS: 82 IU/L (ref 39–117)
ALT: 22 IU/L (ref 0–32)
AST: 22 IU/L (ref 0–40)
Albumin/Globulin Ratio: 2.1 (ref 1.2–2.2)
Albumin: 4.4 g/dL (ref 3.8–4.9)
BUN/Creatinine Ratio: 26 — ABNORMAL HIGH (ref 9–23)
BUN: 21 mg/dL (ref 6–24)
Bilirubin Total: 0.4 mg/dL (ref 0.0–1.2)
CO2: 22 mmol/L (ref 20–29)
Calcium: 9.6 mg/dL (ref 8.7–10.2)
Chloride: 104 mmol/L (ref 96–106)
Creatinine, Ser: 0.81 mg/dL (ref 0.57–1.00)
GFR calc Af Amer: 95 mL/min/{1.73_m2} (ref >59)
GFR calc non Af Amer: 83 mL/min/{1.73_m2} (ref >59)
Globulin, Total: 2.1 g/dL (ref 1.5–4.5)
Glucose: 102 mg/dL — ABNORMAL HIGH (ref 65–99)
POTASSIUM: 3.9 mmol/L (ref 3.5–5.2)
Sodium: 142 mmol/L (ref 134–144)
Total Protein: 6.5 g/dL (ref 6.0–8.5)

## 2018-09-09 LAB — CBC WITH DIFFERENTIAL/PLATELET
BASOS ABS: 0 10*3/uL (ref 0.0–0.2)
Basos: 1 %
EOS (ABSOLUTE): 0.1 10*3/uL (ref 0.0–0.4)
Eos: 2 %
Hematocrit: 42.5 % (ref 34.0–46.6)
Hemoglobin: 14.8 g/dL (ref 11.1–15.9)
IMMATURE GRANULOCYTES: 0 %
Immature Grans (Abs): 0 10*3/uL (ref 0.0–0.1)
Lymphocytes Absolute: 1.5 10*3/uL (ref 0.7–3.1)
Lymphs: 29 %
MCH: 32.3 pg (ref 26.6–33.0)
MCHC: 34.8 g/dL (ref 31.5–35.7)
MCV: 93 fL (ref 79–97)
Monocytes Absolute: 0.6 10*3/uL (ref 0.1–0.9)
Monocytes: 11 %
NEUTROS PCT: 57 %
Neutrophils Absolute: 3 10*3/uL (ref 1.4–7.0)
Platelets: 185 10*3/uL (ref 150–450)
RBC: 4.58 x10E6/uL (ref 3.77–5.28)
RDW: 13.1 % (ref 11.7–15.4)
WBC: 5.3 10*3/uL (ref 3.4–10.8)

## 2018-09-09 LAB — IRON AND TIBC
Iron Saturation: 20 % (ref 15–55)
Iron: 66 ug/dL (ref 27–159)
Total Iron Binding Capacity: 332 ug/dL (ref 250–450)
UIBC: 266 ug/dL (ref 131–425)

## 2018-09-09 LAB — THYROID PANEL WITH TSH
Free Thyroxine Index: 1.8 (ref 1.2–4.9)
T3 Uptake Ratio: 21 % — ABNORMAL LOW (ref 24–39)
T4 TOTAL: 8.6 ug/dL (ref 4.5–12.0)
TSH: 2.64 u[IU]/mL (ref 0.450–4.500)

## 2018-09-09 LAB — VITAMIN D 25 HYDROXY (VIT D DEFICIENCY, FRACTURES): Vit D, 25-Hydroxy: 25.4 ng/mL — ABNORMAL LOW (ref 30.0–100.0)

## 2018-09-09 NOTE — Telephone Encounter (Signed)
Allean Found NP at [facility] [phone] is contact

## 2018-09-09 NOTE — Telephone Encounter (Signed)
I called Gina Found NP at number provider but went to voice mail. Dr. Kendrick Fries has a consult slot available tomorrow in [place] at 2pm. atient is able to see him then. Will forward to Jess to schedule new patient visit re: multifocal pneumonia

## 2018-09-09 NOTE — Progress Notes (Signed)
Thank you :)

## 2018-09-09 NOTE — Telephone Encounter (Signed)
[facility] called Gina Spencer office wanting to confirm whether patient needs to go to the ED now, or if she can wait for apt tomorrow. Stated they would prefer for her to see Dr. Sung Amabile. Dr. Sung Amabile took a look at CT and stated patient will be okay for apt tomorrow. Scheduled 11:00 in [place] for consult. Nothing further needed at this time.

## 2018-09-09 NOTE — Telephone Encounter (Signed)
NP from [facility] calling to see if patient can be seen by a pulmonologist. Patient not see in office before, had CT and CXR. Shows fluid and multifocal pna. Patient has been treated for pna and is getting worse. Tried to set up consult NP didn't know if a day or two was okay wanted someone to look at images and determine how soon patient needs to be seen. Also suggested patient could go to ED and then MD's there would consult pulmonary. She did not want to do that either. She wants someone to call her and let her know what needs to be done with patient.   Routing to [employer] as App of morning

## 2018-09-09 NOTE — Progress Notes (Signed)
Heather,  Dr Sullivan Lone meant to send this to you. Thanks [employer]

## 2018-09-10 ENCOUNTER — Encounter: Payer: Self-pay | Admitting: Pulmonary Disease

## 2018-09-10 ENCOUNTER — Ambulatory Visit: Payer: BLUE CROSS/BLUE SHIELD | Admitting: Pulmonary Disease

## 2018-09-10 ENCOUNTER — Other Ambulatory Visit: Payer: Self-pay

## 2018-09-10 ENCOUNTER — Other Ambulatory Visit
Admission: RE | Admit: 2018-09-10 | Discharge: 2018-09-10 | Disposition: A | Discharge status: Home / Self Care | Payer: BLUE CROSS/BLUE SHIELD | Source: Ambulatory Visit | Attending: Pulmonary Disease | Admitting: Pulmonary Disease

## 2018-09-10 VITALS — BP 118/76 | HR 81 | Ht 65.0 in | Wt 188.6 lb

## 2018-09-10 DIAGNOSIS — Z836 Family history of other diseases of the respiratory system: Secondary | ICD-10-CM | POA: Insufficient documentation

## 2018-09-10 DIAGNOSIS — R059 Cough, unspecified: Secondary | ICD-10-CM

## 2018-09-10 DIAGNOSIS — R0609 Other forms of dyspnea: Secondary | ICD-10-CM | POA: Diagnosis not present

## 2018-09-10 DIAGNOSIS — J90 Pleural effusion, not elsewhere classified: Secondary | ICD-10-CM | POA: Diagnosis not present

## 2018-09-10 DIAGNOSIS — Z79899 Other long term (current) drug therapy: Secondary | ICD-10-CM | POA: Diagnosis not present

## 2018-09-10 DIAGNOSIS — Z7952 Long term (current) use of systemic steroids: Secondary | ICD-10-CM | POA: Diagnosis not present

## 2018-09-10 DIAGNOSIS — R05 Cough: Secondary | ICD-10-CM | POA: Diagnosis not present

## 2018-09-10 DIAGNOSIS — J189 Pneumonia, unspecified organism: Secondary | ICD-10-CM

## 2018-09-10 LAB — C-REACTIVE PROTEIN: CRP: 0.8 mg/dL (ref <1.0)

## 2018-09-10 LAB — SEDIMENTATION RATE: Sed Rate: 16 mm/h (ref 0–30)

## 2018-09-10 MED ORDER — PREDNISONE 20 MG PO TABS: 40.0000 mg | ORAL_TABLET | Freq: Every day | ORAL | 0 refills | Status: AC

## 2018-09-10 MED ORDER — FLUTICASONE FUROATE-VILANTEROL 100-25 MCG/INH IN AEPB: 1.0000 | INHALATION_SPRAY | Freq: Every day | RESPIRATORY_TRACT | 0 refills | Status: DC

## 2018-09-10 NOTE — Patient Instructions (Signed)
Blood tests today: HIV, ESR, CRP, ANA, RF, HSP panel  Prednisone 40 mg (2 x 20 mg) daily for 5 days  Sample of Breo inhaler provided.  1 elation daily.  Rinse mouth after use.  If you feel that this helps her symptoms, contact us and we will place a prescription or you may fill the previously ordered Advair  Follow-up in 2 weeks with CXR prior to that visit

## 2018-09-11 ENCOUNTER — Encounter: Payer: Self-pay | Admitting: Pulmonary Disease

## 2018-09-11 LAB — ANA W/REFLEX IF POSITIVE: Anti Nuclear Antibody(ANA): NEGATIVE

## 2018-09-11 LAB — HIV ANTIBODY (ROUTINE TESTING W REFLEX): HIV Screen 4th Generation wRfx: NONREACTIVE

## 2018-09-11 LAB — RHEUMATOID FACTOR: Rheumatoid fact SerPl-aCnc: 10.7 IU/mL (ref 0.0–13.9)

## 2018-09-11 NOTE — Progress Notes (Signed)
PULMONARY CONSULT NOTE  Requesting MD/Service: Liz Malady, PA Date of initial consultation: 09/10/18 Reason for consultation: Dyspnea and cough  PT PROFILE: 53 y.o. female never smoker referred for evaluation of 2 months worth of respiratory symptoms and abnormalities on CT chest  DATA: 09/08/18 CT chest: Patchy bilateral groundglass opacities with loculated pleural effusion within the major fissure on right.  4 mm nodule in posterior lateral LLL.   INTERVAL:  HPI:  Her history of present illness dates back to 07/27/2018.  At that time she developed a "flulike" illness with fever and malaise.  Her daughter suffered a pneumonia around the same time.  A chest x-ray (not available for my review) was performed on 1/29 and she was also diagnosed with pneumonia.  She is an [employer] and was treated through their health care services.  She was treated with prednisone, albuterol, Tessalon Perles.  Per her report, she did not receive antibiotics.  Her symptoms improved and she went back to work on 2/10 but had a relapse of symptoms on 2/28 with subjective fever, ear fullness, nasal congestion, sinus fullness, cough.  She sometimes hears a "crackling" sound when she breathes.  With the symptoms, levofloxacin was initially prescribed but caused "tendinitis".  Azithromycin was prescribed on 3/2.  She was also prescribed Advair but she has not filled this prescription.  Overall she feels improved but continues to have shortness of breath and nonproductive cough.  She has no further fever or myalgias.  She denies pleuritic chest pain and hemoptysis.  She does continue to occasionally hear "crackles" when she is breathing at night.  Her cough is exacerbated by a supine position.  She denies orthopnea and paroxysmal nocturnal dyspnea.  She has no edema.  She does report mild throat pain and otalgia.  She rarely has dysphagia.  She she does not have a significant arthritis but she has chronic  foot pain.  Her daughter's illness was similar to this and has resolved.  She has no significant occupational exposures.  She works at a desk job.  She does have 1 cat and 1 dog in the home.  She has no unusual hobbies or other respiratory exposures.  She has no significant travel history.  She is never been exposed to tuberculosis.  She has been tested in the past in the course of her employment and has never had a positive test for tuberculosis.  Past Medical History:  Diagnosis Date  . Depression   . Fatigue   . H/O chest pain   . Skipped heart beats     Past Surgical History:  Procedure Laterality Date  . none      MEDICATIONS: I have reviewed all medications and confirmed regimen as documented  Social History   Socioeconomic History  . Marital status: Married    Spouse name: Not on file  . Number of children: Not on file  . Years of education: Not on file  . Highest education level: Not on file  Occupational History  . Not on file  Social Needs  . Financial resource strain: Not on file  . Food insecurity:    Worry: Not on file    Inability: Not on file  . Transportation needs:    Medical: Not on file    Non-medical: Not on file  Tobacco Use  . Smoking status: Never Smoker  . Smokeless tobacco: Never Used  Substance and Sexual Activity  . Alcohol use: No  . Drug use: Never  .  Sexual activity: Not on file  Lifestyle  . Physical activity:    Days per week: Not on file    Minutes per session: Not on file  . Stress: Not on file  Relationships  . Social connections:    Talks on phone: Not on file    Gets together: Not on file    Attends religious service: Not on file    Active member of club or organization: Not on file    Attends meetings of clubs or organizations: Not on file    Relationship status: Not on file  . Intimate partner violence:    Fear of current or ex partner: Not on file    Emotionally abused: Not on file    Physically abused: Not on file     Forced sexual activity: Not on file  Other Topics Concern  . Not on file  Social History Narrative  . Not on file    Family History  Problem Relation Age of Onset  . Emphysema Maternal Grandfather     ROS: No fever, myalgias/arthralgias, unexplained weight loss or weight gain No new focal weakness or sensory deficits No otalgia, hearing loss, visual changes, nasal and sinus symptoms, mouth and throat problems No neck pain or adenopathy No abdominal pain, N/V/D, diarrhea, change in bowel pattern No dysuria, change in urinary pattern   Vitals:   09/10/18 1104  BP: 118/76  Pulse: 81  SpO2: 95%  Weight: 188 lb 9.6 oz (85.5 kg)  Height: _0  (1.651 m)  Room air   EXAM:  Gen: WDWN, No overt respiratory distress HEENT: NCAT, sclera white, tympanic canals and membranes normal, mild rhinitis, oropharynx normal Neck: Supple without LAN, thyromegaly, JVD Lungs: breath sounds full with normal percussion.  Few scattered wheezes, left basilar crackles Cardiovascular: RRR, no murmurs noted Abdomen: Soft, nontender, normal BS Ext: without clubbing, cyanosis, edema Neuro: CNs grossly intact, motor and sensory intact Skin: Limited exam, no lesions noted  DATA:   BMP Latest Ref Rng & Units 09/07/2018 12/17/2012 04/13/2010  Glucose 65 - 99 mg/dL 102(H) 133(H) 82  BUN 6 - 24 mg/dL _1 Creatinine 0.57 - 1.00 mg/dL 0.81 1.01 0.86  BUN/Creat Ratio 9 - 23 26(H) - -  Sodium 134 - 144 mmol/L 142 140 141  Potassium 3.5 - 5.2 mmol/L 3.9 3.4(L) 4.3  Chloride 96 - 106 mmol/L 104 105 107  CO2 20 - 29 mmol/L _2 Calcium 8.7 - 10.2 mg/dL 9.6 8.9 9.1    CBC Latest Ref Rng & Units 09/07/2018 12/17/2012 04/13/2010  WBC 3.4 - 10.8 x10E3/uL 5.3 9.4 5.1  Hemoglobin 11.1 - 15.9 g/dL 14.8 13.3 13.6  Hematocrit 34.0 - 46.6 % 42.5 39.1 38.9  Platelets 150 - 450 x10E3/uL 185 134(L) 168    CXR 03/02: Right lower lobe opacity which corresponds to loculated fluid noted on CT of chest.   Otherwise no definite infiltrates noted  I have personally reviewed all chest radiographs reported above including CXRs and CT chest unless otherwise indicated  IMPRESSION:     ICD-10-CM   1. Pneumonitis J18.9 DG Chest 2 View    HIV Antibody (routine testing w rflx)    Sedimentation rate    ANA    Rheumatoid factor    Hypersensitivity pnuemonitis profile    C-reactive protein  2. Cough R05   3. Dyspnea on exertion R06.09   4. Pleural effusion loculated in major fissure on right J90    Her symptoms  and radiographic findings most likely represent a viral or post viral phenomenon.  She is been appropriately treated for possible bacterial infections with azithromycin.  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  1 inhalation daily.  Rinse mouth after use.  If she feels that this is beneficial with regard to her symptoms, she may fill the previously prescribed Advair or contact us and we will place an order for [facility].  Follow-up in 2 weeks with CXR prior to that visit    Merton Border, MD PCCM service Mobile [phone] Pager [phone] 09/11/2018 8:33 AM

## 2018-09-14 ENCOUNTER — Telehealth: Payer: Self-pay | Admitting: Medical

## 2018-09-14 NOTE — Telephone Encounter (Signed)
  Called patient about her Pulmonary appointment. She stated he told her it looked like  It looked like a fluid which is infection. She has a follow up with Pulmonary 2 weeks with chest x-ray.  She states she is feeling better overall, and has mild shortness of breath while exhaling at times.  Placed on Steroids x 5 days

## 2018-09-14 NOTE — Progress Notes (Signed)
Patient seen on Thursday 09/10/2018

## 2018-09-16 LAB — HYPERSENSITIVITY PNEUMONITIS
A. Pullulans Abs: POSITIVE — AB
A.Fumigatus #1 Abs: NEGATIVE
Micropolyspora faeni, IgG: POSITIVE — AB
Pigeon Serum Abs: NEGATIVE
Thermoact. Saccharii: NEGATIVE
Thermoactinomyces vulgaris, IgG: NEGATIVE

## 2018-09-23 ENCOUNTER — Telehealth: Payer: Self-pay

## 2018-09-23 NOTE — Telephone Encounter (Signed)
Called patient for COVID-19 screening.  Have you recently traveled any where out of the local area in the last 2 weeks? no  Have you been in close contact with a person diagnosed with COVID-19 within the last 2 weeks?no  Do you currently have any fever, cough, or shortness of breath?no   Okay to proceed with visit.    

## 2018-09-24 ENCOUNTER — Encounter: Payer: Self-pay | Admitting: Pulmonary Disease

## 2018-09-24 ENCOUNTER — Telehealth: Payer: Self-pay | Admitting: Pulmonary Disease

## 2018-09-24 ENCOUNTER — Ambulatory Visit
Admission: RE | Admit: 2018-09-24 | Discharge: 2018-09-24 | Disposition: A | Discharge status: Home / Self Care | Payer: BLUE CROSS/BLUE SHIELD | Attending: Pulmonary Disease | Admitting: Pulmonary Disease

## 2018-09-24 ENCOUNTER — Ambulatory Visit
Admission: RE | Admit: 2018-09-24 | Discharge: 2018-09-24 | Disposition: A | Discharge status: Home / Self Care | Payer: BLUE CROSS/BLUE SHIELD | Source: Ambulatory Visit | Attending: Pulmonary Disease | Admitting: Pulmonary Disease

## 2018-09-24 ENCOUNTER — Other Ambulatory Visit: Payer: Self-pay

## 2018-09-24 ENCOUNTER — Ambulatory Visit: Payer: BLUE CROSS/BLUE SHIELD | Admitting: Pulmonary Disease

## 2018-09-24 VITALS — BP 112/72 | HR 81 | Ht 65.0 in | Wt 189.6 lb

## 2018-09-24 DIAGNOSIS — R918 Other nonspecific abnormal finding of lung field: Secondary | ICD-10-CM | POA: Diagnosis not present

## 2018-09-24 DIAGNOSIS — J189 Pneumonia, unspecified organism: Secondary | ICD-10-CM | POA: Diagnosis not present

## 2018-09-24 NOTE — Progress Notes (Signed)
PULMONARY OFFICE FOLLOW-UP NOTE  Requesting MD/Service: Liz Malady, PA Date of initial consultation: 09/10/18 Reason for consultation: Dyspnea and cough  PT PROFILE: 53 y.o. female never smoker referred for evaluation of 2 months worth of respiratory symptoms and abnormalities on CT chest  DATA: 09/08/18 CT chest: Patchy bilateral groundglass opacities with loculated pleural effusion within the major fissure on right.  4 mm nodule in posterior lateral LLL. 09/10/18: Serology evaluation: ANA negative, rheumatoid factor negative.  HIV negative. ESR, CRP normal. 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  At that time, treated with 5-day course of prednisone and trial of Breo inhaler No major pulmonary events in the interim   SUBJ::  This is a scheduled follow-up.  Since last visit, her symptoms have almost completely resolved.  She denies fever, sinus symptoms, cough, shortness of breath.  Previously, she described a "crackling" sound when she breathes.  She no longer hears this.  She denies chest pain, hemoptysis, lower extremity edema, calf tenderness  Vitals:   09/24/18 1036 09/24/18 1047  BP:  112/72  Pulse:  81  SpO2:  96%  Weight: 189 lb 9.6 oz (86 kg)   Height: 5' 5"  (1.651 m)   Room air   EXAM:  Gen: NAD HEENT: NCAT, sclerae white Neck: No JVD Lungs: breath sounds full, no wheezes or other adventitious sounds Cardiovascular: RRR, no murmurs Abdomen: Soft, nontender, normal BS Ext: without clubbing, cyanosis, edema Neuro: grossly intact Skin: Limited exam, no lesions noted   DATA:   BMP Latest Ref Rng & Units 09/07/2018 12/17/2012 04/13/2010  Glucose 65 - 99 mg/dL 102(H) 133(H) 82  BUN 6 - 24 mg/dL 21 13 14   Creatinine 0.57 - 1.00 mg/dL 0.81 1.01 0.86  BUN/Creat Ratio 9 - 23 26(H) - -  Sodium 134 - 144 mmol/L 142 140 141  Potassium 3.5 - 5.2 mmol/L 3.9 3.4(L) 4.3  Chloride 96 -  106 mmol/L 104 105 107  CO2 20 - 29 mmol/L 22 27 26   Calcium 8.7 - 10.2 mg/dL 9.6 8.9 9.1    CBC Latest Ref Rng & Units 09/07/2018 12/17/2012 04/13/2010  WBC 3.4 - 10.8 x10E3/uL 5.3 9.4 5.1  Hemoglobin 11.1 - 15.9 g/dL 14.8 13.3 13.6  Hematocrit 34.0 - 46.6 % 42.5 39.1 38.9  Platelets 150 - 450 x10E3/uL 185 134(L) 168    CXR 09/24/18: Improving opacity in right lung base (previously demonstrated to be effusion loculated in major fissure on right).  No other infiltrates noted.  I have personally reviewed all chest radiographs reported above including CXRs and CT chest unless otherwise indicated  IMPRESSION:     ICD-10-CM   1. Pneumonitis J18.9   2. Opacity of lung on imaging study R91.8 DG Chest 2 View   Her acute illness, previously characterized as "pneumonitis" appears to be resolved.  CT scan previously demonstrated a loculated effusion in the major fissure on the right.  By CXR, this appears to be improving.  Serologic work-up was negative.  The hypersensitivity panel is positive as documented above but this is of uncertain significance, especially in light of clinical improvement.   PLAN:  Repeat CXR in 6-8 weeks to document full resolution of opacity in R lung base. We will contact her with CXR results when available to me and determine whether any further follow-up is warranted   Merton Border, MD PCCM service Mobile [phone] Pager [phone] 09/24/2018 11:11 AM

## 2018-09-24 NOTE — Telephone Encounter (Addendum)
Received call report from [facility] for CXR performed on 09/24/18. Results are within epic. Pt has scheduled OV today with DS.  Will route to DS as an [place]

## 2018-09-24 NOTE — Patient Instructions (Signed)
No further evaluation or treatment is planned for now  Repeat chest x-ray in 6-8 weeks.  I will contact you with results and we will determine whether any further evaluation is warranted.  I anticipate that chest x-ray should be completely normal

## 2018-10-01 NOTE — Telephone Encounter (Signed)
Results were discussed with pt during her 09/24/18 office visit.

## 2018-12-23 ENCOUNTER — Encounter: Payer: Self-pay | Admitting: Medical

## 2018-12-31 ENCOUNTER — Other Ambulatory Visit: Payer: Self-pay

## 2018-12-31 ENCOUNTER — Ambulatory Visit
Admission: RE | Admit: 2018-12-31 | Discharge: 2018-12-31 | Disposition: A | Discharge status: Home / Self Care | Payer: BC Managed Care – PPO | Source: Ambulatory Visit | Attending: Pulmonary Disease | Admitting: Pulmonary Disease

## 2018-12-31 DIAGNOSIS — R918 Other nonspecific abnormal finding of lung field: Secondary | ICD-10-CM | POA: Diagnosis not present

## 2019-02-09 ENCOUNTER — Emergency Department (HOSPITAL_COMMUNITY)
Admission: EM | Admit: 2019-02-09 | Discharge: 2019-02-09 | Disposition: A | Discharge status: Home / Self Care | Payer: BC Managed Care – PPO | Attending: Emergency Medicine | Admitting: Emergency Medicine

## 2019-02-09 ENCOUNTER — Encounter (HOSPITAL_COMMUNITY): Payer: Self-pay | Admitting: Emergency Medicine

## 2019-02-09 ENCOUNTER — Emergency Department (HOSPITAL_COMMUNITY): Payer: BC Managed Care – PPO

## 2019-02-09 DIAGNOSIS — Y9389 Activity, other specified: Secondary | ICD-10-CM | POA: Diagnosis not present

## 2019-02-09 DIAGNOSIS — S51012A Laceration without foreign body of left elbow, initial encounter: Secondary | ICD-10-CM

## 2019-02-09 DIAGNOSIS — S59802A Other specified injuries of left elbow, initial encounter: Secondary | ICD-10-CM | POA: Diagnosis present

## 2019-02-09 DIAGNOSIS — Z79899 Other long term (current) drug therapy: Secondary | ICD-10-CM | POA: Insufficient documentation

## 2019-02-09 DIAGNOSIS — Y999 Unspecified external cause status: Secondary | ICD-10-CM | POA: Diagnosis not present

## 2019-02-09 DIAGNOSIS — Z23 Encounter for immunization: Secondary | ICD-10-CM | POA: Diagnosis not present

## 2019-02-09 DIAGNOSIS — S51022A Laceration with foreign body of left elbow, initial encounter: Secondary | ICD-10-CM | POA: Diagnosis not present

## 2019-02-09 DIAGNOSIS — Y92008 Other place in unspecified non-institutional (private) residence as the place of occurrence of the external cause: Secondary | ICD-10-CM | POA: Insufficient documentation

## 2019-02-09 DIAGNOSIS — W25XXXA Contact with sharp glass, initial encounter: Secondary | ICD-10-CM | POA: Insufficient documentation

## 2019-02-09 MED ORDER — LIDOCAINE-EPINEPHRINE (PF) 2 %-1:200000 IJ SOLN
20.0000 mL | Freq: Once | INTRAMUSCULAR | Status: AC
  Administered 2019-02-09: 20 mL
  Filled 2019-02-09: qty 20

## 2019-02-09 MED ORDER — TETANUS-DIPHTH-ACELL PERTUSSIS 5-2.5-18.5 LF-MCG/0.5 IM SUSP
0.5000 mL | Freq: Once | INTRAMUSCULAR | Status: AC
  Administered 2019-02-09: 0.5 mL via INTRAMUSCULAR
  Filled 2019-02-09: qty 0.5

## 2019-02-09 NOTE — ED Notes (Signed)
NS and bandaid to left elbow and sling placed on left arm good circulation and sensation after application.

## 2019-02-09 NOTE — ED Provider Notes (Signed)
[facility]-EMERGENCY DEPT Provider Note   CSN: [id number] Arrival date & time: 02/09/19  2037    History   Chief Complaint Chief Complaint  Patient presents with  . Extremity Laceration    HPI Kem BoroughsJeanica M Winward is a 53 y.o. female.     The history is provided by the patient and medical records. No language interpreter was used.   Matilde BashJeanica M Goodroe is a 53 y.o. female  with a PMH as listed below who presents to the Emergency Department complaining of laceration to the left elbow which occurred just prior to arrival.  Patient states that she was outside on her porch when she accidentally tripped and hit her elbow on the glass door which subsequently broke.  She did not hit her head.  Only complaint is laceration and pain to the left elbow.  She is not on any anticoagulants.  No medication or treatments other than applying direct pressure prior to arrival.  No numbness or weakness.  Past Medical History:  Diagnosis Date  . Depression   . Fatigue   . H/O chest pain   . Skipped heart beats     Patient Active Problem List   Diagnosis Date Noted  . H/O: C-section 08/07/2018  . Colitis 08/07/2018  . Depressive disorder 08/07/2018  . Dysmenorrhea 08/07/2018  . Kidney stone 08/07/2018  . Reduced libido 09/23/2016  . Other and unspecified hyperlipidemia 04/02/2012  . Neutropenia, unspecified (HCC) 10/30/2009  . 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 Obesity, unspecified 11/02/2007  . Obstructive sleep apnea 04/07/2007    Past Surgical History:  Procedure Laterality Date  . none       OB History   No obstetric history on file.      Home Medications    Prior to Admission medications   Medication Sig Start Date End Date Taking? Authorizing Provider  albuterol (PROVENTIL HFA;VENTOLIN HFA) 108 (90 Base) MCG/ACT inhaler Inhale 2 puffs into the lungs every 6 (six) hours as needed for wheezing or shortness of  breath. 08/03/18   Ratcliffe, Heather R, PA-C  fluticasone (FLONASE) 50 MCG/ACT nasal spray Place 1 spray into both nostrils 2 (two) times daily. 08/07/18 08/07/19  Betancourt, Jarold Songina A, NP  fluticasone furoate-vilanterol (BREO ELLIPTA) 100-25 MCG/INH AEPB Inhale 1 puff into the lungs daily. 09/10/18   Merwyn KatosSimonds, David B, MD    Family History Family History  Problem Relation Age of Onset  . Emphysema Maternal Grandfather     Social History Social History   Tobacco Use  . Smoking status: Never Smoker  . Smokeless tobacco: Never Used  Substance Use Topics  . Alcohol use: No  . Drug use: Never     Allergies   Levaquin [levofloxacin] and Penicillins   Review of Systems Review of Systems  Musculoskeletal: Positive for myalgias.  Skin: Positive for wound.  Neurological: Negative for weakness and numbness.     Physical Exam Updated Vital Signs BP 134/79   Pulse 80   Temp 98.4 F (36.9 C) (Oral)   Resp 18   Ht 5\' 5"  (1.651 m)   Wt 88.5 kg   SpO2 97%   BMI 32.45 kg/m   Physical Exam Vitals signs and nursing note reviewed.  Constitutional:      General: She is not in acute distress.    Appearance: She is well-developed.  HENT:     Head: Normocephalic and atraumatic.  Neck:     Musculoskeletal: Neck supple.  Cardiovascular:  Rate and Rhythm: Normal rate and regular rhythm.     Heart sounds: Normal heart sounds. No murmur.  Pulmonary:     Effort: Pulmonary effort is normal. No respiratory distress.     Breath sounds: Normal breath sounds. No wheezing or rales.  Musculoskeletal:     Comments: Left upper extremity with full range of motion. 2+ radial pulse.  Sensation intact to radial, ulnar and median nerve distributions.  She does have tenderness to the left elbow.  Skin:    General: Skin is warm and dry.     Comments: 4 cm "c" shaped laceration to the left elbow.   Neurological:     Mental Status: She is alert.      ED Treatments / Results  Labs (all labs  ordered are listed, but only abnormal results are displayed) Labs Reviewed - No data to display  EKG None  Radiology Dg Elbow Complete Left  Result Date: 02/09/2019 CLINICAL DATA:  53 year old female fall through glass. Laceration at the tip of the elbow EXAM: LEFT ELBOW - COMPLETE 3+ VIEW COMPARISON:  None. FINDINGS: Mild soft tissue swelling superficial to the olecranon with a few thin linear radiodensities which may reflect retained glass in the soft tissues. No acute fracture or traumatic malalignment of the left elbow. Alignment is grossly preserved. Unable to assess for elbow effusion given the to position arm due to pain IMPRESSION: 1. Mild soft tissue swelling superficial to the olecranon with a few thin linear radiodensities which may reflect retained glass in the soft tissues. 2. No acute osseous abnormality. Electronically Signed   By: Lovena Le M.D.   On: 02/09/2019 21:46    Procedures .Marland KitchenLaceration Repair  Date/Time: 02/09/2019 10:51 PM Performed by: Duval Macleod, Ozella Almond, PA-C Authorized by: Erick Oxendine, Ozella Almond, PA-C   Consent:    Consent obtained:  Verbal   Consent given by:  Patient   Risks discussed:  Pain, infection, poor cosmetic result and poor wound healing Anesthesia (see MAR for exact dosages):    Anesthesia method:  Local infiltration   Local anesthetic:  Lidocaine 2% WITH epi Laceration details:    Location:  Shoulder/arm   Shoulder/arm location:  L elbow   Length (cm):  4 Pre-procedure details:    Preparation:  Patient was prepped and draped in usual sterile fashion and imaging obtained to evaluate for foreign bodies Exploration:    Hemostasis achieved with:  Direct pressure   Wound exploration: wound explored through full range of motion and entire depth of wound probed and visualized   Treatment:    Area cleansed with:  Saline and Betadine   Amount of cleaning:  Extensive   Irrigation solution:  Sterile saline   Irrigation volume:  1L   Irrigation  method:  Syringe   Visualized foreign bodies/material removed: yes   Skin repair:    Repair method:  Sutures   Suture size:  4-0   Suture material:  Prolene   Suture technique:  Simple interrupted   Number of sutures:  8 Post-procedure details:    Patient tolerance of procedure:  Tolerated well, no immediate complications   (including critical care time)  Medications Ordered in ED Medications  lidocaine-EPINEPHrine (XYLOCAINE W/EPI) 2 %-1:200000 (PF) injection 20 mL (20 mLs Infiltration Given 02/09/19 2208)  Tdap (BOOSTRIX) injection 0.5 mL (0.5 mLs Intramuscular Given 02/09/19 2209)     Initial Impression / Assessment and Plan / ED Course  I have reviewed the triage vital signs and the nursing notes.  Pertinent labs & imaging results that were available during my care of the patient were reviewed by me and considered in my medical decision making (see chart for details).       Matilde BashJeanica M Hainer is a 53 y.o. female who presents to ED for laceration of left elbow. X-ray negative for bony abnormalities. Does show a few very thin radiodensities which could reflect retained glass in soft tissue. The wound was very thoroughly irrigated in the ED tonight by myself.  Wound explored after irrigation without any signs of foreign body. Laceration repaired as dictated above. Patient counseled on home wound care. Follow up with PCP/urgent care or return to ER for suture removal in 7-10 days. Patient was urged to return to the Emergency Department for worsening pain, swelling, expanding erythema especially if it streaks away from the affected area, fever, or for any additional concerns. Patient verbalized understanding. All questions answered.  Patient discussed with Dr. Silverio LayYao who agrees with treatment plan.    Final Clinical Impressions(s) / ED Diagnoses   Final diagnoses:  Laceration of left elbow, initial encounter    ED Discharge Orders    None       Neel Buffone, Chase PicketJaime Pilcher, PA-C  02/09/19 2302    Charlynne PanderYao, David Hsienta, MD 02/09/19 [phone]

## 2019-02-09 NOTE — ED Triage Notes (Signed)
Patient here from home reporting falling through glass door. Laceration to left elbow. Bleeding controlled. Denies blood thinners.

## 2019-02-09 NOTE — Discharge Instructions (Addendum)
It was my pleasure taking care of you today!  ° °Keep wound clean with mild soap and water. Keep area covered with a topical antibiotic ointment and bandage, keep bandage dry, and do not submerge in water for 24 hours. Ice and elevate for additional pain relief and swelling. Alternate between ibuprofen and Tylenol for additional pain relief. Follow up with your primary care doctor, [facility] or ER in approximately 7-10 days for wound recheck and suture removal. Monitor area for signs of infection to include, but not limited to: increasing pain, spreading redness, drainage/pus, worsening swelling, or fevers. Return to emergency department for emergent changing or worsening symptoms. °

## 2019-02-22 ENCOUNTER — Encounter: Payer: Self-pay | Admitting: Medical

## 2019-02-22 ENCOUNTER — Ambulatory Visit: Payer: Self-pay | Admitting: Medical

## 2019-02-22 ENCOUNTER — Other Ambulatory Visit: Payer: Self-pay

## 2019-02-22 VITALS — BP 95/58 | HR 111 | Temp 97.7°F | Resp 18 | Wt 198.6 lb

## 2019-02-22 DIAGNOSIS — Z4802 Encounter for removal of sutures: Secondary | ICD-10-CM

## 2019-02-22 NOTE — Progress Notes (Signed)
Patient comes to clinic for suture removal on Left elbow.  She is a  53 yo female in non acute distress. Blood pressure (!) 95/58, pulse (!) 111, temperature 97.7 F (36.5 C), temperature source Tympanic, resp. rate 18, weight 198 lb 9.6 oz (90.1 kg), SpO2 98 %.  Allergies  Allergen Reactions  . Levaquin [Levofloxacin]     History of tendonitis in left foot.  Marland Kitchen Penicillins      She tolerated the procedure well. No signs of infection. Triple antibiotic ointment applied and area covered with a bandage. Reviewed wound care with patient and given AVS. Return to the clinic as needed.  Patient verbalizes understanding and has no questions at discharge.

## 2019-02-22 NOTE — Patient Instructions (Signed)
Suture Removal, Care After This sheet gives you information about how to care for yourself after your procedure. Your health care provider may also give you more specific instructions. If you have problems or questions, contact your health care provider. What can I expect after the procedure? After your stitches (sutures) are removed, it is common to have:  Some discomfort and swelling in the area.  Slight redness in the area. Follow these instructions at home: If you have a bandage:  Wash your hands with soap and water before you change your bandage (dressing). If soap and water are not available, use hand sanitizer.  Change your dressing as told by your health care provider. If your dressing becomes wet or dirty, or develops a bad smell, change it as soon as possible.  If your dressing sticks to your skin, soak it in warm water to loosen it. Wound care   Check your wound every day for signs of infection. Check for: ? More redness, swelling, or pain. ? Fluid or blood. ? Warmth. ? Pus or a bad smell.  Wash your hands with soap and water before and after touching your wound.  Apply cream or ointment only as directed by your health care provider. If you are using cream or ointment, wash the area with soap and water 2 times a day to remove all the cream or ointment. Rinse off the soap and pat the area dry with a clean towel.  If you have skin glue or adhesive strips on your wound, leave these closures in place. They may need to stay in place for 2 weeks or longer. If adhesive strip edges start to loosen and curl up, you may trim the loose edges. Do not remove adhesive strips completely unless your health care provider tells you to do that.  Keep the wound area dry and clean. Do not take baths, swim, or use a hot tub until your health care provider approves.  Continue to protect the wound from injury.  Do not pick at your wound. Picking can cause an infection.  When your wound has  completely healed, wear sunscreen over it or cover it with clothing when you are outside. New scars get sunburned easily, which can make scarring worse. General instructions  Take over-the-counter and prescription medicines only as told by your health care provider.  Keep all follow-up visits as told by your health care provider. This is important. Contact a health care provider if:  You have redness, swelling, or pain around your wound.  You have fluid or blood coming from your wound.  Your wound feels warm to the touch.  You have pus or a bad smell coming from your wound.  Your wound opens up. Get help right away if:  You have a fever.  You have redness that is spreading from your wound. Summary  After your sutures are removed, it is common to have some discomfort and swelling in the area.  Wash your hands with soap and water before you change your bandage (dressing).  Keep the wound area dry and clean. Do not take baths, swim, or use a hot tub until your health care provider approves. This information is not intended to replace advice given to you by your health care provider. Make sure you discuss any questions you have with your health care provider. Document Released: 03/19/2001 Document Revised: 06/06/2017 Document Reviewed: 07/30/2016 Elsevier Patient Education  2020 Elsevier Inc.  

## 2019-03-31 ENCOUNTER — Other Ambulatory Visit: Payer: Self-pay

## 2019-03-31 ENCOUNTER — Ambulatory Visit: Payer: Self-pay

## 2019-03-31 DIAGNOSIS — Z23 Encounter for immunization: Secondary | ICD-10-CM

## 2019-06-16 ENCOUNTER — Encounter: Payer: Self-pay | Admitting: Medical

## 2019-06-16 ENCOUNTER — Ambulatory Visit: Payer: Self-pay | Admitting: Medical

## 2019-06-16 ENCOUNTER — Other Ambulatory Visit: Payer: Self-pay

## 2019-06-16 VITALS — BP 112/57 | HR 100 | Temp 98.2°F | Resp 16 | Ht 60.0 in | Wt 204.0 lb

## 2019-06-16 DIAGNOSIS — H1032 Unspecified acute conjunctivitis, left eye: Secondary | ICD-10-CM

## 2019-06-16 MED ORDER — ERYTHROMYCIN 5 MG/GM OP OINT: TOPICAL_OINTMENT | OPHTHALMIC | 0 refills | Status: DC

## 2019-06-16 NOTE — Progress Notes (Addendum)
   Subjective:    Patient ID: Gina Spencer, female    DOB: 31-Aug-1965, 53 y.o.   MRN: [id number]  HPI 53 yo female in non acute distress presents today with left eye" blood shot. Woke up this morning with left red eye. No visual changes , no HA or fever or chills.   Review of Systems  Constitutional: Negative for chills and fatigue.  HENT: Negative for congestion and sore throat.   Eyes: Positive for discharge (feels wet at times) and redness (left eye). Negative for photophobia, pain, itching and visual disturbance.  Respiratory: Negative for shortness of breath.   Cardiovascular: Negative for chest pain.  Hematological: Negative.    20/30 left  20/30 right 20/30 bilateral    Objective:   Physical Exam Vitals signs and nursing note reviewed.  Constitutional:      Appearance: Normal appearance.  HENT:     Head: Normocephalic and atraumatic.  Eyes:     General: Lids are normal. Vision grossly intact.        Right eye: No discharge.        Left eye: No discharge.     Extraocular Movements: Extraocular movements intact.     Right eye: Normal extraocular motion and no nystagmus.     Left eye: Normal extraocular motion and no nystagmus.     Conjunctiva/sclera:     Right eye: Right conjunctiva is not injected. No chemosis, exudate or hemorrhage.    Left eye: Left conjunctiva is injected. No chemosis, exudate or hemorrhage.    Pupils: Pupils are equal, round, and reactive to light.  Neurological:     Mental Status: She is alert.           Assessment & Plan:  Conjunctivitis Left eye Meds ordered this encounter  Medications  . erythromycin ophthalmic ointment    Sig: applt to the left eye 3 times a day x  5-7 days.    Dispense:  3.5 g    Refill:  0  return in 3 days if worsening, call if any concerns. Recommended to stay at home till 24 hour treatment with medication.  Reviewed hygiene with patient , own towel and wash cloth. Cool compresses, motirn or tylenol  as directed on package for discomfort.  Blot don't rub eye. Reviewed how to put medication in eye. Patient verbalizes understanding and has no questions at discharge.

## 2019-07-21 ENCOUNTER — Encounter: Payer: Self-pay | Admitting: Medical

## 2019-08-23 IMAGING — CR LEFT ELBOW - COMPLETE 3+ VIEW
4 series · 4 of 4 positions shown · non-contrast
Comparison: None.

CLINICAL DATA: 52-year-old female fall through glass. Laceration at
the tip of the elbow

EXAM:
LEFT ELBOW - COMPLETE 3+ VIEW

[x elbow ap left]
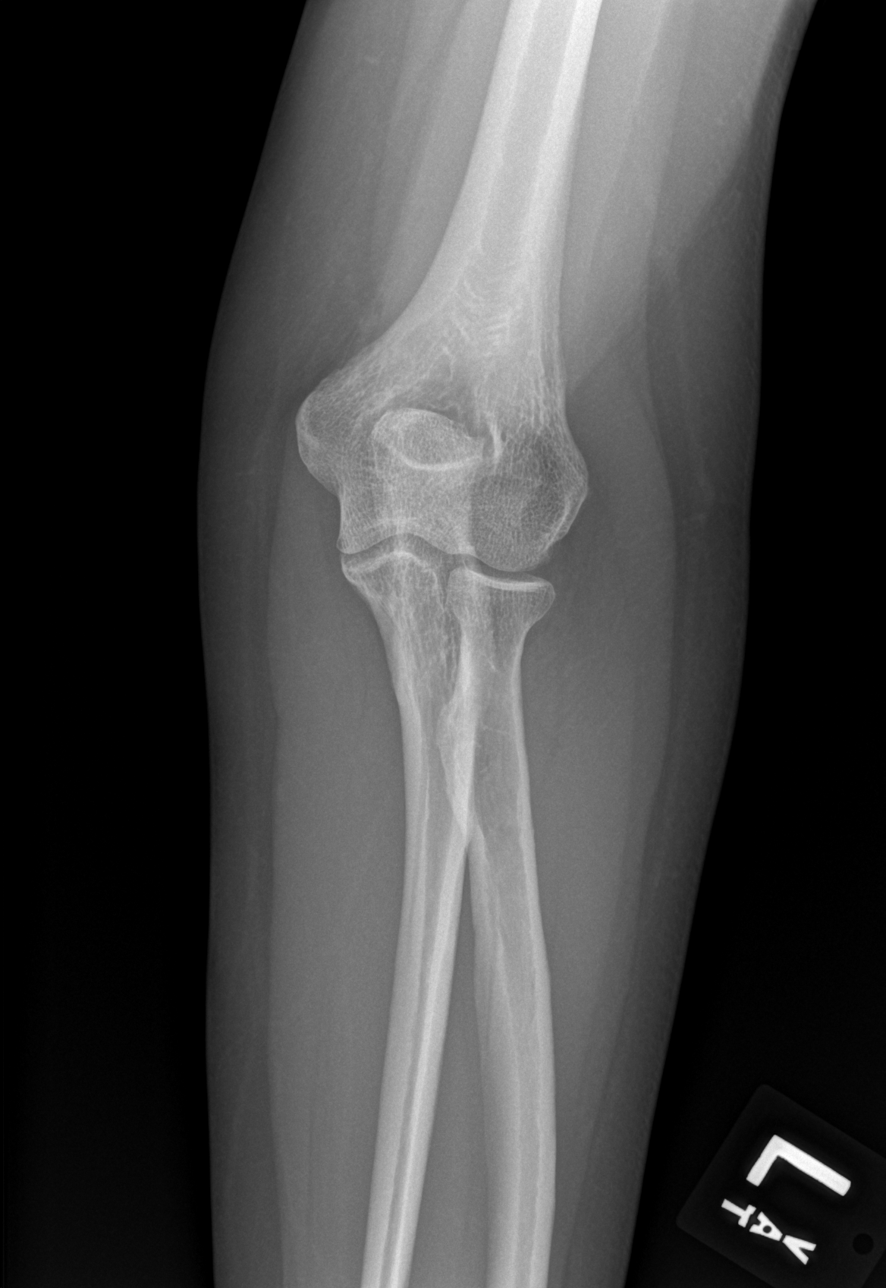

[x elbow obl left (1 of 2)]
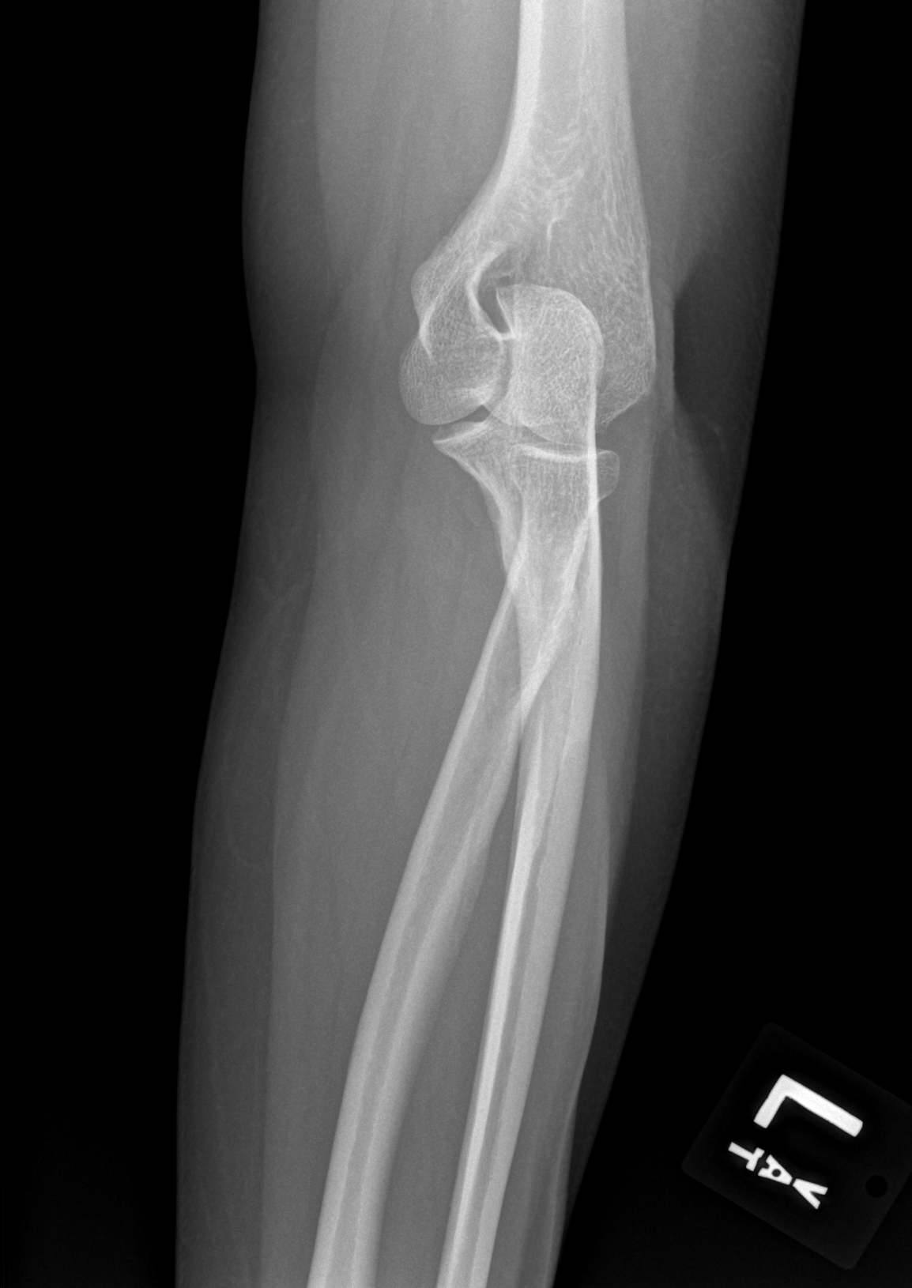

[x elbow obl left (2 of 2)]
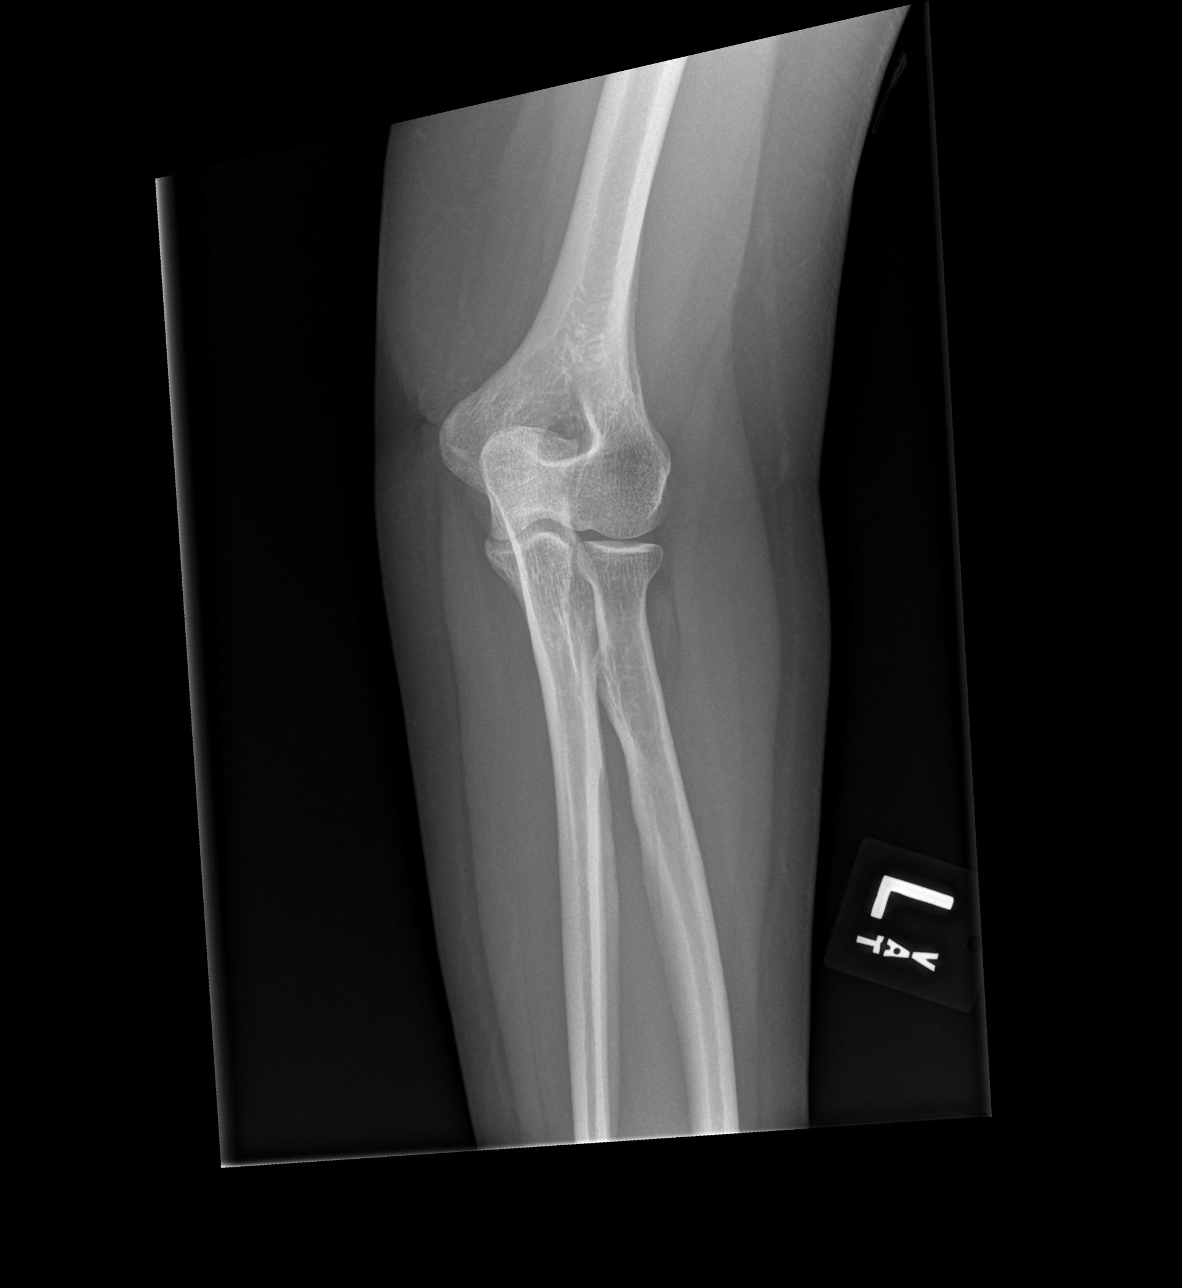

[x elbow lat left]
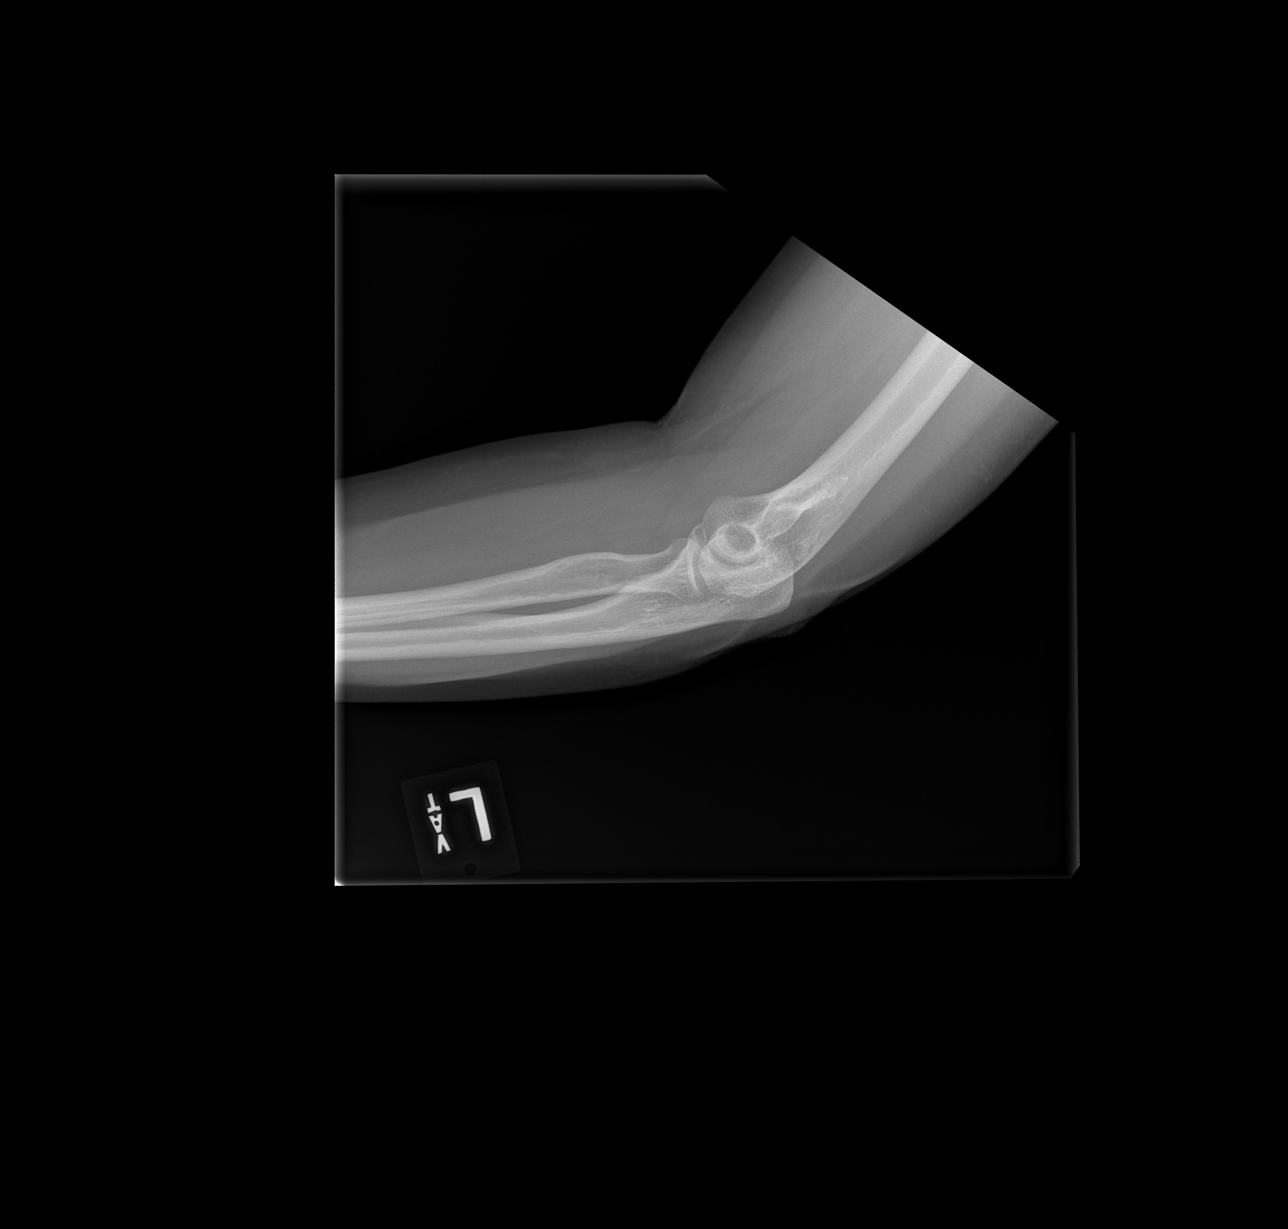

[4 of 4 positions shown; findings below may reference images not displayed]

FINDINGS: Mild soft tissue swelling superficial to the olecranon with a few
thin linear radiodensities which may reflect retained glass in the
soft tissues. No acute fracture or traumatic malalignment of the
left elbow. Alignment is grossly preserved. Unable to assess for
elbow effusion given the to position arm due to pain
IMPRESSION: 1. Mild soft tissue swelling superficial to the olecranon with a few
thin linear radiodensities which may reflect retained glass in the
soft tissues.
2. No acute osseous abnormality.

## 2020-01-11 ENCOUNTER — Ambulatory Visit
Admission: RE | Admit: 2020-01-11 | Discharge: 2020-01-11 | Disposition: A | Discharge status: Home / Self Care | Payer: BC Managed Care – PPO | Source: Ambulatory Visit | Attending: Medical | Admitting: Medical

## 2020-01-11 ENCOUNTER — Other Ambulatory Visit: Payer: Self-pay

## 2020-01-11 ENCOUNTER — Ambulatory Visit
Admission: RE | Admit: 2020-01-11 | Discharge: 2020-01-11 | Disposition: A | Discharge status: Home / Self Care | Payer: BC Managed Care – PPO | Attending: Medical | Admitting: Medical

## 2020-01-11 ENCOUNTER — Encounter: Payer: Self-pay | Admitting: Medical

## 2020-01-11 ENCOUNTER — Ambulatory Visit: Payer: Self-pay | Admitting: Medical

## 2020-01-11 VITALS — BP 116/71 | HR 73 | Temp 97.5°F | Resp 16 | Wt 202.0 lb

## 2020-01-11 DIAGNOSIS — R0781 Pleurodynia: Secondary | ICD-10-CM

## 2020-01-11 NOTE — Patient Instructions (Signed)
Rib Fracture  A rib fracture is a break or crack in one of the bones of the ribs. The ribs are like a cage that goes around your upper chest. A broken or cracked rib is often painful, but most do not cause other problems. Most rib fractures usually heal on their own in 1-3 months. Follow these instructions at home: Managing pain, stiffness, and swelling  If directed, apply ice to the injured area. ? Put ice in a plastic bag. ? Place a towel between your skin and the bag. ? Leave the ice on for 20 minutes, 2-3 times a day.  Take over-the-counter and prescription medicines only as told by your doctor. Activity  Avoid activities that cause pain to the injured area. Protect your injured area.  Slowly increase activity as told by your doctor. General instructions  Do deep breathing as told by your doctor. You may be told to: ? Take deep breaths many times a day. ? Cough many times a day while hugging a pillow. ? Use a device (incentive spirometer) to do deep breathing many times a day.  Drink enough fluid to keep your pee (urine) clear or pale yellow.  Do not wear a rib belt or binder. These do not allow you to breathe deeply.  Keep all follow-up visits as told by your doctor. This is important. Contact a doctor if:  You have a fever. Get help right away if:  You have trouble breathing.  You are short of breath.  You cannot stop coughing.  You cough up thick or bloody spit (sputum).  You feel sick to your stomach (nauseous), throw up (vomit), or have belly (abdominal) pain.  Your pain gets worse and medicine does not help. Summary  A rib fracture is a break or crack in one of the bones of the ribs.  Apply ice to the injured area and take medicines for pain as told by your doctor.  Take deep breaths and cough many times a day. Hug a pillow every time you cough. This information is not intended to replace advice given to you by your health care provider. Make sure you  discuss any questions you have with your health care provider. Document Revised: 06/06/2017 Document Reviewed: 09/24/2016 Elsevier Patient Education  2020 Elsevier Inc.  

## 2020-01-11 NOTE — Progress Notes (Signed)
Subjective:    Patient ID: Gina Spencer, female    DOB: [birth year], 54 y.o.   MRN: [id number]  HPI 54 yo female in non acute distress. Fell on Saturday afternoon stepping onto her back desk. Denies hitting head or syncope. Thinks perhaps her elbow into her left side. Complains of left sided rib pain. Took Ibuprofen on Saturday - Monday 400mg   BID nothing today..  Blood pressure 116/71, pulse 73, temperature (!) 97.5 F (36.4 C), temperature source Temporal, resp. rate 16, weight 202 lb (91.6 kg), SpO2 100 %.  Allergies  Allergen Reactions  . Levaquin [Levofloxacin]     History of tendonitis in left foot.  Penicillins     Current Outpatient Medications:  .  buPROPion (WELLBUTRIN XL) 300 MG 24 hr tablet, Take 300 mg by mouth daily., Disp: , Rfl:   Review of Systems  Respiratory: Positive for shortness of breath (at times). Negative for cough, chest tightness and wheezing.   Cardiovascular: Positive for chest pain (lower per heart per patinet). Negative for palpitations and leg swelling.  Neurological: Negative for syncope.  Psychiatric/Behavioral: Negative for self-injury and suicidal ideas. The patient is not nervous/anxious.    No asthma medications she used them when she was sick.    Objective:   Physical Exam Vitals and nursing note reviewed.  Constitutional:      Appearance: She is well-developed.  Eyes:     Extraocular Movements: Extraocular movements intact.     Pupils: Pupils are equal, round, and reactive to light.  Cardiovascular:     Rate and Rhythm: Normal rate and regular rhythm.     Heart sounds: Normal heart sounds.  Pulmonary:     Effort: Pulmonary effort is normal. No tachypnea, accessory muscle usage or respiratory distress.     Breath sounds: Normal breath sounds. No stridor.  Musculoskeletal:        General: Normal range of motion.     Right lower leg: No edema.     Left lower leg: No edema.  Skin:    General: Skin is warm and dry.      Capillary Refill: Capillary refill takes less than 2 seconds.     Findings: No ecchymosis.  Neurological:     General: No focal deficit present.     Mental Status: She is alert.  Psychiatric:        Mood and Affect: Mood normal.        Behavior: Behavior normal.   no diminished breath sounds. No Cervical or Thoracic No bruising of the chest side or the left breast.  No bruising or abrasions noted elsewhere on patient skin.         Assessment & Plan:  Left sided rib pain s/p fall. Sent for xray of CXR and left sided ribs. Reviwed with patient how to splint. Orders Placed This Encounter  Procedures  . DG Chest 1 View    Standing Status:   Future    Standing Expiration Date:   01/10/2021    Order Specific Question:   Reason for Exam (SYMPTOM  OR DIAGNOSIS REQUIRED)    Answer:   fell, complains of left sided rib pain    Order Specific Question:   Is patient pregnant?    Answer:   No    Order Specific Question:   Preferred imaging location?    Answer:   [facility]    Order Specific Question:   Call Results- Best Contact Number?    Answer:  [phone]    Order Specific Question:   Radiology Contrast Protocol - do NOT remove file path    Answer:   \\[email or web address]  . DG Ribs Unilateral Left    Standing Status:   Future    Standing Expiration Date:   01/10/2021    Order Specific Question:   Reason for Exam (SYMPTOM  OR DIAGNOSIS REQUIRED)    Answer:   pain on left side of ribs after a fall    Order Specific Question:   Is patient pregnant?    Answer:   No    Order Specific Question:   Preferred imaging location?    Answer:   [facility]    Order Specific Question:   Call Results- Best Contact Number?    Answer:   [phone]    Order Specific Question:   Radiology Contrast Protocol - do NOT remove file path    Answer:   \\[email or web address]   Increase Ibuprofen 800mg   Every  8 hours with food.  Will contact patient with results once received Patient berbalizes understanding and has no questions at discharge. 

## 2020-01-21 ENCOUNTER — Telehealth: Payer: Self-pay | Admitting: Medical

## 2020-01-21 NOTE — Telephone Encounter (Signed)
Called patient and reviewed x-rays.  No fractures no pneumothorax.  Narrative & Impression  CLINICAL DATA:  Recent fall with left-sided chest pain, initial encounter  EXAM: CHEST  1 VIEW  COMPARISON:  12/31/2018  FINDINGS: Cardiac shadow is stable. Lungs are well aerated bilaterally. Stable ovoid density is noted overlying the right lung base consistent with an trapped fluid within the major fissure as seen on prior exams. No other focal abnormality is noted.  IMPRESSION: Stable loculated fluid within the major fissure on the right.  No acute abnormality is noted.   Electronically Signed   By: Alcide Clever M.D.   On: 01/11/2020 22:58    Narrative & Impression  CLINICAL DATA:  Recent fall with left rib pain, initial encounter  EXAM: LEFT RIBS - 2 VIEW  COMPARISON:  None.  FINDINGS: No fracture or other bone lesions are seen involving the ribs.  IMPRESSION: No acute abnormality noted.   Electronically Signed   By: Alcide Clever M.D.   On: 01/11/2020 22:58   Patient may take  Ibuprofen 800 mg  Every 8 hours as needed for pain. May also splint with a pillow to help pain during coughing, sneezing etc. Return to the clinic as needed.  Patient verbalizes understanding and has no questions at discharge.

## 2020-07-24 IMAGING — CR DG CHEST 1V
1 series · 1 of 1 positions shown · non-contrast
Comparison: 12/31/2018

CLINICAL DATA: Recent fall with left-sided chest pain, initial
encounter

EXAM:
CHEST  1 VIEW

[dg chest 1 view]
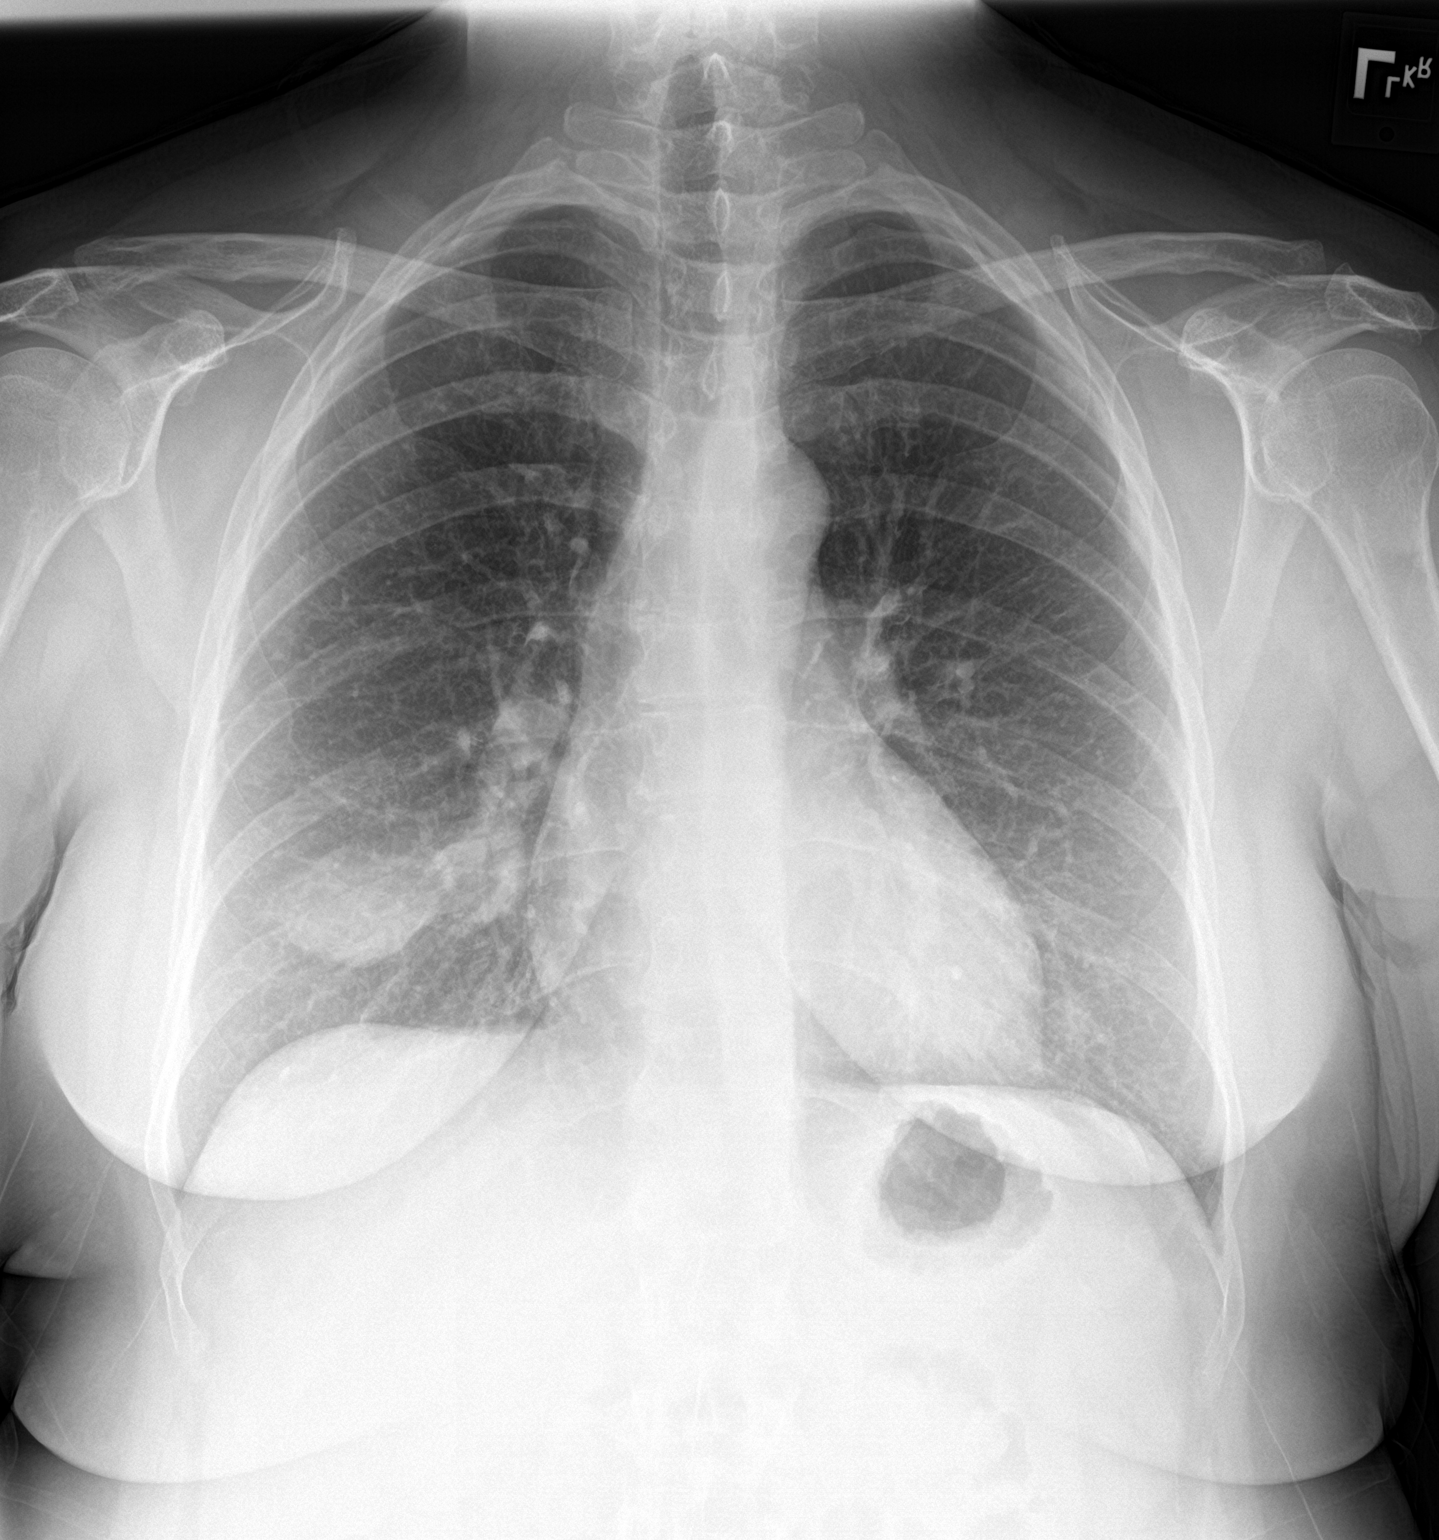

[1 of 1 positions shown; findings below may reference images not displayed]

FINDINGS: Cardiac shadow is stable. Lungs are well aerated bilaterally. Stable
ovoid density is noted overlying the right lung base consistent with
an trapped fluid within the major fissure as seen on prior exams. No
other focal abnormality is noted.
IMPRESSION: Stable loculated fluid within the major fissure on the right.

No acute abnormality is noted.

## 2020-09-04 ENCOUNTER — Ambulatory Visit: Payer: BC Managed Care – PPO | Admitting: Medical

## 2020-09-04 ENCOUNTER — Other Ambulatory Visit: Payer: Self-pay

## 2020-09-04 ENCOUNTER — Encounter: Payer: Self-pay | Admitting: Medical

## 2020-09-04 VITALS — BP 106/68 | HR 100 | Temp 98.4°F | Resp 16 | Wt 210.2 lb

## 2020-09-04 DIAGNOSIS — J302 Other seasonal allergic rhinitis: Secondary | ICD-10-CM

## 2020-09-04 DIAGNOSIS — H6501 Acute serous otitis media, right ear: Secondary | ICD-10-CM

## 2020-09-04 MED ORDER — AZITHROMYCIN 250 MG PO TABS: ORAL_TABLET | ORAL | 0 refills | Status: DC

## 2020-09-04 NOTE — Progress Notes (Signed)
Subjective:    Patient ID: Gina Spencer, female    DOB: 01/10/1966, 55 y.o.   MRN: [id number]  HPI  55 yo female in non acute distress.  Painful right ear yesterday, all day, discomfort today like  if yawning. No fever or chills.  No other symptoms. Blood pressure 106/68, pulse 100, temperature 98.4 F (36.9 C), temperature source Oral, resp. rate 16, weight 210 lb 3.2 oz (95.3 kg), SpO2 97 %.  Allergies  Allergen Reactions  . Levaquin [Levofloxacin]     History of tendonitis in left foot.  Marland Kitchen Penicillins    Past Medical History:  Diagnosis Date  . Depression   . Fatigue   . H/O chest pain   . Skipped heart beats       Review of Systems  HENT: Positive for ear pain. Negative for ear discharge, sneezing and sore throat.   Respiratory: Negative for cough, shortness of breath and wheezing.   Cardiovascular: Negative for chest pain.  Gastrointestinal: Negative for abdominal pain and diarrhea.  Neurological: Negative for dizziness, syncope and headaches.   No hearing problems  both Ears itching inside    Objective:   Physical Exam Vitals and nursing note reviewed.  Constitutional:      Appearance: Normal appearance.  HENT:     Head: Normocephalic and atraumatic.     Jaw: There is normal jaw occlusion.     Right Ear: Hearing, ear canal and external ear normal. A middle ear effusion is present.     Left Ear: Hearing, ear canal and external ear normal. A middle ear effusion is present.  Eyes:     General: Lids are normal. Vision grossly intact. Gaze aligned appropriately.     Extraocular Movements: Extraocular movements intact.     Conjunctiva/sclera: Conjunctivae normal.     Pupils: Pupils are equal, round, and reactive to light.  Cardiovascular:     Rate and Rhythm: Normal rate and regular rhythm.     Heart sounds: Normal heart sounds.  Pulmonary:     Effort: Pulmonary effort is normal.     Breath sounds: Normal breath sounds.  Musculoskeletal:         General: Normal range of motion.     Cervical back: Normal range of motion and neck supple. No tenderness.  Lymphadenopathy:     Cervical: No cervical adenopathy.  Skin:    General: Skin is warm and dry.  Neurological:     General: No focal deficit present.     Mental Status: She is alert and oriented to person, place, and time.  Psychiatric:        Mood and Affect: Mood normal.        Behavior: Behavior normal.        Thought Content: Thought content normal.        Judgment: Judgment normal.           Assessment & Plan:  Otitis media  Right seems to  improving Seasonal allergies Start OTC Zyrtec and Flonase per package instructions. Start OTC decongestant like Mucinex per package instructions. If ear is improving no need for antibiotics, same or worse start antibiotics. Meds ordered this encounter  Medications  . azithromycin (ZITHROMAX Z-PAK) 250 MG tablet    Sig: Take 2 tablets today by mouth then one tablet days 2-5, take with food.    Dispense:  6 each    Refill:  0  Return to clinic in  3-5 days if not improving. Patient verbalizes  understanding and has no questions at discharge.

## 2020-09-04 NOTE — Patient Instructions (Signed)
Otitis Media, Adult  Otitis media is a condition in which the middle ear is red and swollen (inflamed) and full of fluid. The middle ear is the part of the ear that contains bones for hearing as well as air that helps send sounds to the brain. The condition usually goes away on its own. What are the causes? This condition is caused by a blockage in the eustachian tube. The eustachian tube connects the middle ear to the back of the nose. It normally allows air into the middle ear. The blockage is caused by fluid or swelling. Problems that can cause blockage include:  A cold or infection that affects the nose, mouth, or throat.  Allergies.  An irritant, such as tobacco smoke.  Adenoids that have become large. The adenoids are soft tissue located in the back of the throat, behind the nose and the roof of the mouth.  Growth or swelling in the upper part of the throat, just behind the nose (nasopharynx).  Damage to the ear caused by change in pressure. This is called barotrauma. What are the signs or symptoms? Symptoms of this condition include:  Ear pain.  Fever.  Problems with hearing.  Being tired.  Fluid leaking from the ear.  Ringing in the ear. How is this treated? This condition can go away on its own within 3-5 days. But if the condition is caused by bacteria or does not go away on its own, or if it keeps coming back, your doctor may:  Give you antibiotic medicines.  Give you medicines for pain. Follow these instructions at home:  Take over-the-counter and prescription medicines only as told by your doctor.  If you were prescribed an antibiotic medicine, take it as told by your doctor. Do not stop taking the antibiotic even if you start to feel better.  Keep all follow-up visits as told by your doctor. This is important. Contact a doctor if:  You have bleeding from your nose.  There is a lump on your neck.  You are not feeling better in 5 days.  You feel worse  instead of better. Get help right away if:  You have pain that is not helped with medicine.  You have swelling, redness, or pain around your ear.  You get a stiff neck.  You cannot move part of your face (paralysis).  You notice that the bone behind your ear hurts when you touch it.  You get a very bad headache. Summary  Otitis media means that the middle ear is red, swollen, and full of fluid.  This condition usually goes away on its own.  If the problem does not go away, treatment may be needed. You may be given medicines to treat the infection or to treat your pain.  If you were prescribed an antibiotic medicine, take it as told by your doctor. Do not stop taking the antibiotic even if you start to feel better.  Keep all follow-up visits as told by your doctor. This is important. This information is not intended to replace advice given to you by your health care provider. Make sure you discuss any questions you have with your health care provider. Document Revised: 05/27/2019 Document Reviewed: 05/27/2019 Elsevier Patient Education  2021 [employer] Allergies, Adult An allergy is a condition in which the body's defense system (immune system) comes in contact with an allergen and reacts to it. An allergen is anything that causes an allergic reaction. Allergens cause the immune system to make proteins  for fighting infections (antibodies). These antibodies cause cells to release chemicals called histamines that set off the symptoms of an allergic reaction. Allergies often affect the nasal passages (allergic rhinitis), eyes (allergic conjunctivitis), skin (atopic dermatitis), and stomach. Allergies can be mild, moderate, or severe. They cannot spread from person to person. Allergies can develop at any age and may be outgrown. What are the causes? This condition is caused by allergens. Common allergens include:  Outdoor allergens, such as pollen, car fumes, and mold.  Indoor  allergens, such as dust, smoke, mold, and pet dander.  Other allergens, such as foods, medicines, scents, insect bites or stings, and other skin irritants. What increases the risk? You are more likely to develop this condition if you have:  Family members with allergies.  Family members who have any condition that may be caused by allergens, such as asthma. This may make you more likely to have other allergies. What are the signs or symptoms? Symptoms of this condition depend on the severity of the allergy. Mild to moderate symptoms  Runny nose, stuffy nose (nasal congestion), or sneezing.  Itchy mouth, ears, or throat.  A feeling of mucus dripping down the back of your throat (postnasal drip).  Sore throat.  Itchy, red, watery, or puffy eyes.  Skin rash, or itchy, red, swollen areas of skin (hives).  Stomach cramps or bloating. Severe symptoms Severe allergies to food, medicine, or insect bites may cause anaphylaxis, which can be life-threatening. Symptoms include:  A red (flushed) face.  Wheezing or coughing.  Swollen lips, tongue, or mouth.  Tight or swollen throat.  Chest pain or tightness, or rapid heartbeat.  Trouble breathing or shortness of breath.  Pain in the abdomen, vomiting, or diarrhea.  Dizziness or fainting. How is this diagnosed? This condition is diagnosed based on your symptoms, your family and medical history, and a physical exam. You may also have tests, including:  Skin tests to see how your skin reacts to allergens that may be causing your symptoms. Tests include: ? Skin prick test. For this test, an allergen is introduced to your body through a small opening in the skin. ? Intradermal skin test. For this test, a small amount of allergen is injected under the first layer of your skin. ? Patch test. For this test, a small amount of allergen is placed on your skin. The area is covered and then checked after a few days.  Blood tests.  A  challenge test. For this test, you will eat or breathe in a small amount of allergen to see if you have an allergic reaction. You may also be asked to:  Keep a food diary. This is a record of all the foods, drinks, and symptoms you have in a day.  Try an elimination diet. To do this: ? Remove certain foods from your diet. ? Add those foods back one by one to find out if any foods cause an allergic reaction. How is this treated? Treatment for allergies depends on your symptoms. Treatment may include:  Cold, wet cloths (cold compresses) to soothe itching and swelling.  Eye drops or nasal sprays.  Nasal irrigation to help clear your mucus or keep the nasal passages moist.  A humidifier to add moisture to the air.  Skin creams to treat rashes or itching.  Oral antihistamines or other medicines to block the reaction or to treat inflammation.  Diet changes to remove foods that cause allergies.  Being exposed again and again to tiny amounts  of allergens to help you build a defense against it (tolerance). This is called immunotherapy. Examples include: ? Allergy shot. You receive an injection that contains an allergen. ? Sublingual immunotherapy. You take a small dose of allergen under your tongue.  Emergency injection for anaphylaxis. You give yourself a shot using a syringe (auto-injector) that contains the amount of medicine you need. Your health care provider will teach you how to give yourself an injection.      Follow these instructions at home: Medicines  Take or apply over-the-counter and prescription medicines only as told by your health care provider.  Always carry your auto-injector pen if you are at risk of anaphylaxis. Give yourself an injection as told by your health care provider.   Eating and drinking  Follow instructions from your health care provider about eating or drinking restrictions.  Drink enough fluid to keep your urine pale yellow. General  instructions  Wear a medical alert bracelet or necklace to let others know that you have had anaphylaxis before.  Avoid known allergens whenever possible.  Keep all follow-up visits as told by your health care provider. This is important. Contact a health care provider if:  Your symptoms do not get better with treatment. Get help right away if:  You have symptoms of anaphylaxis. These include: ? Swollen mouth, tongue, or throat. ? Pain or tightness in your chest. ? Trouble breathing or shortness of breath. ? Dizziness or fainting. ? Severe abdominal pain, vomiting, or diarrhea. These symptoms may represent a serious problem that is an emergency. Do not wait to see if the symptoms will go away. Get medical help right away. Call your local emergency services (911 in the [place] Do not drive yourself to the hospital. Summary  Take or apply over-the-counter and prescription medicines only as told by your health care provider.  Avoid known allergens when possible.  Always carry your auto-injector pen if you are at risk of anaphylaxis. Give yourself an injection as told by your health care provider.  Wear a medical alert bracelet or necklace to let others know that you have had anaphylaxis before.  Anaphylaxis is a life-threatening emergency. Get help right away. This information is not intended to replace advice given to you by your health care provider. Make sure you discuss any questions you have with your health care provider. Document Revised: 02/21/2020 Document Reviewed: 05/05/2019 Elsevier Patient Education  2021 [employer].

## 2021-11-07 ENCOUNTER — Ambulatory Visit: Payer: BC Managed Care – PPO | Admitting: Nurse Practitioner

## 2021-11-07 ENCOUNTER — Encounter: Payer: Self-pay | Admitting: Nurse Practitioner

## 2021-11-07 VITALS — BP 118/78 | HR 97 | Temp 98.9°F | Resp 16

## 2021-11-07 DIAGNOSIS — M7989 Other specified soft tissue disorders: Secondary | ICD-10-CM

## 2021-11-07 DIAGNOSIS — M79604 Pain in right leg: Secondary | ICD-10-CM

## 2021-11-07 NOTE — Progress Notes (Signed)
? ?  Subjective:  ? ? Patient ID: Gina Spencer, female    DOB: April 10, 1966, 56 y.o.   MRN: [id number] ? ?HPI ?56 year old female presenting with 2 weeks of right leg pain.  ?Started when she got out of bed 2 weeks ago. The pain is improving, not as present in the am but worsens throughout the day. She typically takes her dogs for walks each am and pain onsets and worsens by 1,000 steps. Her pain is in her right calf- starts at knee and extends to front shin. ? ?Denies any known injury  ?Denies new exercises  ?Typically uses a standing desk but has been unable to do so since pain onset.  ?Denies chronic back pain or new back pain ? ?Denies recent surgery ?Denies recent illness ?Denies recent vaccination  ? ?Denies a history of blood clots ? ?She takes no daily medications  ? ?She has tried using ibuprofen with food relief, but stomach upset. Moderate relief from tylenol. No relief from Naproxen.  ? ?Denies changes in sensation to extremity.  ? ? ?Today's Vitals  ? 11/07/21 1603  ?BP: 118/78  ?Pulse: 97  ?Resp: 16  ?Temp: 98.9 ?F (37.2 ?C)  ?TempSrc: Tympanic  ?SpO2: 97%  ? ?There is no height or weight on file to calculate BMI.  ? ? ?Review of Systems  ?Constitutional: Negative.   ?HENT: Negative.    ?Respiratory: Negative.    ?Cardiovascular: Negative.   ?Gastrointestinal: Negative.   ?Musculoskeletal:  Positive for myalgias.  ?Skin: Negative.   ?Neurological: Negative.   ? ?   ?Objective:  ? Physical Exam ?Musculoskeletal:  ?   Right knee: Normal. No swelling, effusion or erythema. No tenderness.  ?   Instability Tests: Anterior drawer test negative. Medial McMurray test negative and lateral McMurray test negative.  ?   Right lower leg: Tenderness present. 1+ Edema present.  ?   Comments: 1in larger diameter to right lower calf when compared with left. Upper calf diameter same. Sock line present no right ankle, not on left ankle.   ?Neurological:  ?   Mental Status: She is alert.  ? ? ? ? ? ?   ?Assessment &  Plan:  ?1. Right leg pain ?2. Right leg swelling ? ?- VAS [place] LOWER EXTREMITY VENOUS (DVT); Future ?   ?Will follow up after doppler has been performed  ?Advised 600mg  ibuprofen with meals twice daily and daily 81mg  ASA ? ?With new or worsening symptoms seek follow up care immediately.  ? ?Ddx tendonitis- if doppler is negative and pain persists consider PT or referral to Ortho  ?

## 2021-11-08 ENCOUNTER — Ambulatory Visit (HOSPITAL_BASED_OUTPATIENT_CLINIC_OR_DEPARTMENT_OTHER)
Admission: RE | Admit: 2021-11-08 | Discharge: 2021-11-08 | Disposition: A | Discharge status: Home / Self Care | Payer: BC Managed Care – PPO | Source: Ambulatory Visit | Attending: Nurse Practitioner | Admitting: Nurse Practitioner

## 2021-11-08 ENCOUNTER — Telehealth: Payer: Self-pay | Admitting: Nurse Practitioner

## 2021-11-08 ENCOUNTER — Other Ambulatory Visit (HOSPITAL_BASED_OUTPATIENT_CLINIC_OR_DEPARTMENT_OTHER): Payer: BC Managed Care – PPO

## 2021-11-08 DIAGNOSIS — M79604 Pain in right leg: Secondary | ICD-10-CM | POA: Diagnosis not present

## 2021-11-08 DIAGNOSIS — M7989 Other specified soft tissue disorders: Secondary | ICD-10-CM | POA: Insufficient documentation

## 2021-11-08 NOTE — Telephone Encounter (Signed)
CLINICAL DATA:  Right leg pain and swelling ?  ?EXAM: ?RIGHT LOWER EXTREMITY VENOUS DOPPLER ULTRASOUND ?  ?TECHNIQUE: ?Gray-scale sonography with compression, as well as color and duplex ?ultrasound, were performed to evaluate the deep venous system(s) ?from the level of the common femoral vein through the popliteal and ?proximal calf veins. ?  ?COMPARISON:  None Available. ?  ?FINDINGS: ?VENOUS ?  ?Normal compressibility of the common femoral, superficial femoral, ?and popliteal veins, as well as the visualized calf veins. ?Visualized portions of profunda femoral vein and great saphenous ?vein unremarkable. No filling defects to suggest DVT on grayscale or ?color Doppler imaging. Doppler waveforms show normal direction of ?venous flow, normal respiratory plasticity and response to ?augmentation. ?  ?Limited views of the contralateral common femoral vein are ?unremarkable. ?  ?OTHER ?  ?None. ?  ?Limitations: none ?  ?IMPRESSION: ?No right lower extremity DVT ?

## 2021-11-14 ENCOUNTER — Other Ambulatory Visit: Payer: Self-pay | Admitting: Nurse Practitioner

## 2021-11-14 ENCOUNTER — Ambulatory Visit: Payer: BC Managed Care – PPO | Admitting: Nurse Practitioner

## 2021-11-14 ENCOUNTER — Encounter: Payer: Self-pay | Admitting: Nurse Practitioner

## 2021-11-14 VITALS — BP 110/72 | HR 93 | Temp 98.8°F | Resp 16

## 2021-11-14 DIAGNOSIS — M779 Enthesopathy, unspecified: Secondary | ICD-10-CM

## 2021-11-14 MED ORDER — PREDNISONE 10 MG (21) PO TBPK: ORAL_TABLET | ORAL | 0 refills | Status: DC

## 2021-11-14 NOTE — Progress Notes (Signed)
? ?  Subjective:  ? ? Patient ID: Gina Spencer, female    DOB: 05-18-1966, 56 y.o.   MRN: [id number] ? ?HPI ? ?56 year old female returning to [facility] with complaints of ongoing right leg pain. She has an onset of spontaneous pain to right lower extremity 3 weeks ago- ?Since has had a negative lower extremity venous doppler performed.  ?(See previous note and imaging results)  ?She has been using ibuprofen for pain relief.  ? ?Pain levels: Sitting pain 3/10 Walking 6/10 ? ?3 Advil at 10am today (600mg ) with minimal relief  ? ?Pain is worse with flexion of her right foot.  ?Denies back pain ?Feels her right leg is achy all the time ?More discomfort after sitting in her car for longer periods of time ? ?Today's Vitals  ? 11/14/21 1600  ?BP: 110/72  ?Pulse: 93  ?Resp: 16  ?Temp: 98.8 ?F (37.1 ?C)  ?TempSrc: Tympanic  ?SpO2: 98%  ? ?There is no height or weight on file to calculate BMI.   ? ?Review of Systems  ?Constitutional:  Positive for fatigue.  ?HENT: Negative.    ?Cardiovascular: Negative.   ?Gastrointestinal: Negative.   ?Musculoskeletal:  Positive for arthralgias.  ?Neurological: Negative.   ? ?   ?Objective:  ? Physical Exam ?HENT:  ?   Head: Normocephalic.  ?Cardiovascular:  ?   Pulses:     ?     Dorsalis pedis pulses are 2+ on the right side.  ?     Posterior tibial pulses are 2+ on the right side.  ?Musculoskeletal:  ?   Cervical back: Normal.  ?   Thoracic back: Normal.  ?   Lumbar back: Normal. No tenderness. Negative right straight leg raise test.  ?   Right lower leg: Tenderness present. No bony tenderness. No edema.  ?   Right ankle: Decreased range of motion.  ?   Right Achilles Tendon: Normal.  ?   Left ankle: Normal.  ?   Left Achilles Tendon: Normal.  ?   Right foot: Normal.  ?   Left foot: Normal.  ?     Legs: ? ?   Comments: Pain disruption, worsens with flexion of foot.   ?Skin: ?   General: Skin is warm.  ?   Capillary Refill: Capillary refill takes less than 2  seconds.  ?Neurological:  ?   General: No focal deficit present.  ?   Mental Status: She is alert.  ?   Sensory: Sensation is intact.  ?   Motor: Motor function is intact.  ?   Coordination: Coordination is intact.  ?Psychiatric:     ?   Mood and Affect: Mood normal.  ? ? ?Tenderness into lateral aspect of right calf with flexion of right foot.  ?Pain with medial rotation of foot into ankle  ?No pain in foot ?No swelling no bruising ?Venous return and distal pulses WNL ? ? ?   ?Assessment & Plan:  ?1. Tendinitis ? ?- predniSONE (STERAPRED UNI-PAK 21 TAB) 10 MG (21) TBPK tablet; Take 6 tablets on day one, 5 on day two, 4 on day three, 3 on day four, 2 on day five, and 1 on day six. Take with food.  Dispense: 21 tablet; Refill: 0 ?   ?If no improvement with steroid pack will consider referral either back to PCP, PT, imaging of lumbar spine, or orthopedic referral as discussed  ? ?RTC if symptoms worsen as discussed  ?

## 2021-11-16 ENCOUNTER — Telehealth: Payer: Self-pay | Admitting: Nurse Practitioner

## 2021-11-16 NOTE — Telephone Encounter (Signed)
Continued right ankle and calf pain has been on prednisone for 24 hours- advised to continue for at least 72 if no improvement she can stop and switch back to ibuprofen. She is looking into massage next week. If no improvement will initiate orthopedic referral  ?

## 2022-02-27 ENCOUNTER — Ambulatory Visit (INDEPENDENT_AMBULATORY_CARE_PROVIDER_SITE_OTHER): Payer: Self-pay | Admitting: Nurse Practitioner

## 2022-02-27 DIAGNOSIS — M79675 Pain in left toe(s): Secondary | ICD-10-CM

## 2022-02-27 DIAGNOSIS — S63501A Unspecified sprain of right wrist, initial encounter: Secondary | ICD-10-CM

## 2022-02-27 DIAGNOSIS — S80212A Abrasion, left knee, initial encounter: Secondary | ICD-10-CM

## 2022-02-27 DIAGNOSIS — M25522 Pain in left elbow: Secondary | ICD-10-CM

## 2022-02-27 DIAGNOSIS — S50312A Abrasion of left elbow, initial encounter: Secondary | ICD-10-CM

## 2022-02-27 DIAGNOSIS — M7989 Other specified soft tissue disorders: Secondary | ICD-10-CM

## 2022-02-27 NOTE — Progress Notes (Signed)
[facility] [address] [place], [place] [address] [phone]  Office Visit Note  Patient Name: Gina Spencer Date of Birth [id number]  Medical Record number [id number]  Date of Service: 02/27/2022     HPI 56 year old female presenting to [facility] with complaints of left elbow, right wrist, left knee and left 5th digit pain after a fall last night.   She was walking her dog when the dog pulled her down onto a step.   She fell onto her left side hitting her left elbow, cutting open her left forearm, left knee.   Her most sore area today is her left 5th toe. She has taken ibuprofen  Current Medication:  Outpatient Encounter Medications as of 02/27/2022  Medication Sig   ibuprofen (ADVIL) 200 MG tablet Take 200 mg by mouth every 6 (six) hours as needed.   predniSONE (STERAPRED UNI-PAK 21 TAB) 10 MG (21) TBPK tablet Take 6 tablets on day one, 5 on day two, 4 on day three, 3 on day four, 2 on day five, and 1 on day six. Take with food.   No facility-administered encounter medications on file as of 02/27/2022.      Medical History: Past Medical History:  Diagnosis Date   Depression    Fatigue    H/O chest pain    Skipped heart beats      Vital Signs: There were no vitals taken for this visit.   Review of Systems  Constitutional: Negative.   HENT: Negative.    Respiratory: Negative.    Genitourinary: Negative.   Musculoskeletal:  Positive for arthralgias.  Skin:  Positive for wound.  Neurological: Negative.     Physical Exam HENT:     Head: Normocephalic.  Pulmonary:     Effort: Pulmonary effort is normal.  Musculoskeletal:     Right upper arm: Laceration and tenderness present.     Left upper arm: Normal.     Right elbow: Laceration present. Normal range of motion. Tenderness present.     Left elbow: Normal.     Right wrist: Swelling and tenderness present. No bony tenderness. Normal range of motion.     Left wrist:  Laceration present. No swelling, tenderness or bony tenderness. Normal range of motion.       Arms:       Hands:     Right foot: Normal.     Left foot: Decreased range of motion. Normal capillary refill. Tenderness present. No bony tenderness. Normal pulse.       Feet:  Skin:    Findings: Abrasion present.          Comments: Abrasions marked in red no active bleeding no signs of infection  Neurological:     General: No focal deficit present.     Mental Status: She is alert.  Psychiatric:        Mood and Affect: Mood normal.      Assessment/Plan: 1. Abrasion of left elbow, initial encounter Will consider Xray if no improvement or worsening symptoms at follow up   2. Abrasion, left knee, initial encounter Continue to ice/ ibuprofen OTC as needed  3. Pain in left elbow Passive ROM no heavy lifting   4. Pain and swelling of toe of left foot Wrapped/stabilized continue to assess for circulation and sensation. Wear supportive shoes when able   5. Sprain of right wrist, initial encounter Wrapped/stabilized will reassess at follow up      General Counseling:  Dajanee verbalizes understanding of the findings of todays visit and agrees with plan of treatment. I have discussed any further diagnostic evaluation that may be needed or ordered today. We also reviewed her medications today. she has been encouraged to call the office with any questions or concerns that should arise related to todays visit.   No orders of the defined types were placed in this encounter.   No orders of the defined types were placed in this encounter.   Time spent:15 Minutes    Viviano Simas [facility] Nurse Practitioner

## 2022-03-01 ENCOUNTER — Encounter: Payer: Self-pay | Admitting: Nurse Practitioner

## 2022-03-01 ENCOUNTER — Ambulatory Visit (INDEPENDENT_AMBULATORY_CARE_PROVIDER_SITE_OTHER): Payer: Self-pay | Admitting: Nurse Practitioner

## 2022-03-01 VITALS — Temp 98.5°F

## 2022-03-01 DIAGNOSIS — M79675 Pain in left toe(s): Secondary | ICD-10-CM

## 2022-03-01 DIAGNOSIS — M7989 Other specified soft tissue disorders: Secondary | ICD-10-CM

## 2022-03-01 DIAGNOSIS — M25522 Pain in left elbow: Secondary | ICD-10-CM

## 2022-03-01 DIAGNOSIS — S50312D Abrasion of left elbow, subsequent encounter: Secondary | ICD-10-CM

## 2022-03-01 DIAGNOSIS — S80212D Abrasion, left knee, subsequent encounter: Secondary | ICD-10-CM

## 2022-03-01 DIAGNOSIS — S63501D Unspecified sprain of right wrist, subsequent encounter: Secondary | ICD-10-CM

## 2022-03-01 NOTE — Progress Notes (Signed)
[facility]rapist[facility] [address] [place], [place] [address] [phone]  Office Visit Note  Patient Name: Gina Spencer Date of Birth [id number]  Medical Record number [id number]  Date of Service: 03/01/2022   HPI  56 year old female returning to [facility] for recheck after multiple abrasions from falling earlier this week- see previous note.   She is overall feeling better today. She has been using ibuprofen 600mg  every 6 hours as needed for pain relief.   Current Medication:  Outpatient Encounter Medications as of 03/01/2022  Medication Sig   ibuprofen (ADVIL) 200 MG tablet Take 200 mg by mouth every 6 (six) hours as needed.   predniSONE (STERAPRED UNI-PAK 21 TAB) 10 MG (21) TBPK tablet Take 6 tablets on day one, 5 on day two, 4 on day three, 3 on day four, 2 on day five, and 1 on day six. Take with food.   No facility-administered encounter medications on file as of 03/01/2022.      Medical History: Past Medical History:  Diagnosis Date   Depression    Fatigue    H/O chest pain    Skipped heart beats      Vital Signs: Today's Vitals   03/01/22 0815  Temp: 98.5 F (36.9 C)  TempSrc: Tympanic   There is no height or weight on file to calculate BMI.   Review of Systems  Constitutional: Negative.   HENT: Negative.    Respiratory: Negative.    Gastrointestinal: Negative.   Musculoskeletal:  Positive for arthralgias.    Physical Exam Pulmonary:     Effort: Pulmonary effort is normal.  Musculoskeletal:     Right wrist: Tenderness present. No swelling or bony tenderness. Normal range of motion. Normal pulse.     Left wrist: Normal.     Left knee: Swelling present. No bony tenderness. Normal range of motion. Tenderness present.     Right foot: Normal.     Left foot: Swelling and tenderness present. Normal pulse.       Legs:     Comments: Bruising to left 5th toe, pain with flexion of toes, distal circulation and sensation intact.   Skin:     General: Skin is warm.     Findings: Abrasion and signs of injury present.       Neurological:     Mental Status: She is alert.       Assessment/Plan: 1. Abrasion of left elbow, subsequent encounter Continue passive ROM and ibuprofen as needed   2. Abrasion of left knee, subsequent encounter  3. Pain in left elbow  4. Pain and swelling of toe of left foot Budy taped toe continue to monitor circulation and sensation to toe, loosen tape as needed   5. Sprain of right wrist, subsequent encounter Continue to wear supportive wrap as needed limit heavy lifting an strenuous activity with right hand      General Counseling: Gina Spencer verbalizes understanding of the findings of todays visit and agrees with plan of treatment. I have discussed any further diagnostic evaluation that may be needed or ordered today. We also reviewed her medications today. she has been encouraged to call the office with any questions or concerns that should arise related to todays visit.     Time spent:15 Minutes    03/03/22 [facility] Nurse Practitioner

## 2023-05-23 ENCOUNTER — Ambulatory Visit: Payer: Self-pay | Admitting: Adult Health

## 2023-05-23 ENCOUNTER — Other Ambulatory Visit: Payer: Self-pay

## 2023-05-23 ENCOUNTER — Encounter: Payer: Self-pay | Admitting: Adult Health

## 2023-05-23 VITALS — BP 94/70 | HR 91 | Temp 98.3°F | Ht 64.0 in | Wt 210.0 lb

## 2023-05-23 DIAGNOSIS — H938X3 Other specified disorders of ear, bilateral: Secondary | ICD-10-CM

## 2023-05-23 NOTE — Progress Notes (Signed)
[facility] [address] [place], [place] [address]   Office Visit Note  Patient Name: Gina Spencer Date of Birth [id number]  Medical Record number [id number]  Date of Service: 05/23/2023  Chief Complaint  Patient presents with   Ear Pain    Patient has been having discomfort in both ears for several months. Recently she began having pain behind her right ear which radiated to her right cheekbone, but not necessarily inside her ear. She slept on a heating pad last night. She has also taken ibuprofen which has been somewhat helpful in alleviating the pain.     HPI Pt is here for a sick visit. She reports bilateral ear pressure.  She noticed it 3 months.  It has been intermittent. Right ear started having pain over the last few days.  Last night was very bad.  Denies sore throat, fever or chills.    Current Medication:  Outpatient Encounter Medications as of 05/23/2023  Medication Sig   buPROPion (WELLBUTRIN XL) 150 MG 24 hr tablet Take 150 mg by mouth daily.   Cyanocobalamin 2500 MCG SUBL Place under the tongue.   ibuprofen (ADVIL) 200 MG tablet Take 200 mg by mouth every 6 (six) hours as needed.   [DISCONTINUED] predniSONE (STERAPRED UNI-PAK 21 TAB) 10 MG (21) TBPK tablet Take 6 tablets on day one, 5 on day two, 4 on day three, 3 on day four, 2 on day five, and 1 on day six. Take with food. (Patient not taking: Reported on 03/01/2022)   No facility-administered encounter medications on file as of 05/23/2023.      Medical History: Past Medical History:  Diagnosis Date   Depression    Fatigue    H/O chest pain    Skipped heart beats      Vital Signs: BP 94/70   Pulse 91   Temp 98.3 F (36.8 C)   Ht 5\' 4"  (1.626 m)   Wt 210 lb (95.3 kg)   SpO2 95%   BMI 36.05 kg/m    Review of Systems  Constitutional:  Negative for chills, fatigue and fever.  HENT:  Positive for ear pain. Negative for congestion.     Physical Exam Vitals reviewed.   Constitutional:      Appearance: Normal appearance.  HENT:     Head: Normocephalic.     Right Ear: Tympanic membrane and ear canal normal.     Left Ear: Tympanic membrane and ear canal normal.     Nose: Nose normal.     Mouth/Throat:     Mouth: Mucous membranes are moist.  Neurological:     Mental Status: She is alert.    Assessment/Plan:  1. Pressure sensation in both ears Discussed using Daily allergy medication such as Zyrtec or Claritin. Also instructed patient to use Flonase, Two sprays in each nostril twice daily.       General Counseling: Crucita verbalizes understanding of the findings of todays visit and agrees with plan of treatment. I have discussed any further diagnostic evaluation that may be needed or ordered today. We also reviewed her medications today. she has been encouraged to call the office with any questions or concerns that should arise related to todays visit.   No orders of the defined types were placed in this encounter.   No orders of the defined types were placed in this encounter.   Time spent:15 Minutes    Johnna Acosta AGNP-C Nurse Practitioner

## 2023-11-20 ENCOUNTER — Other Ambulatory Visit: Payer: Self-pay

## 2023-11-20 ENCOUNTER — Encounter: Payer: Self-pay | Admitting: Adult Health

## 2023-11-20 ENCOUNTER — Ambulatory Visit: Payer: Self-pay | Admitting: Adult Health

## 2023-11-20 VITALS — BP 138/85 | HR 89 | Temp 97.8°F | Ht 64.0 in

## 2023-11-20 DIAGNOSIS — L03116 Cellulitis of left lower limb: Secondary | ICD-10-CM

## 2023-11-20 DIAGNOSIS — S80862A Insect bite (nonvenomous), left lower leg, initial encounter: Secondary | ICD-10-CM

## 2023-11-20 DIAGNOSIS — W57XXXA Bitten or stung by nonvenomous insect and other nonvenomous arthropods, initial encounter: Secondary | ICD-10-CM

## 2023-11-20 MED ORDER — DOXYCYCLINE HYCLATE 100 MG PO TABS: 100.0000 mg | ORAL_TABLET | Freq: Two times a day (BID) | ORAL | 0 refills | Status: AC

## 2023-11-20 NOTE — Progress Notes (Signed)
 [facility] [address] [place], [place] [address]   Office Visit Note  Patient Name: Gina Spencer Date of Birth [id number]  Medical Record number [id number]  Date of Service: 11/20/2023  Chief Complaint  Patient presents with   Acute Visit    Patient states she found a tick on her lower L leg two days ago. She used tweezers to pull it off but is not ure if she got it out completely. She has redness around the bite area and states it has been getting spreading.      HPI Pt is here for a sick visit. 2 days ago she noticed a tick on her left posterior knee.  She removed the tick and now has redness and swelling a the site.  The redness progressed through her markings, and she is here for antibiotics.    Current Medication:  Outpatient Encounter Medications as of 11/20/2023  Medication Sig   Cyanocobalamin 2500 MCG SUBL Place under the tongue.   doxycycline  (VIBRA -TABS) 100 MG tablet Take 1 tablet (100 mg total) by mouth 2 (two) times daily.   ibuprofen (ADVIL) 200 MG tablet Take 200 mg by mouth every 6 (six) hours as needed.   VITAMIN D  PO Take by mouth daily.   [DISCONTINUED] buPROPion (WELLBUTRIN XL) 150 MG 24 hr tablet Take 150 mg by mouth daily.   No facility-administered encounter medications on file as of 11/20/2023.      Medical History: Past Medical History:  Diagnosis Date   Depression    Fatigue    H/O chest pain    Skipped heart beats      Vital Signs: BP 138/85   Pulse 89   Temp 97.8 F (36.6 C)   Ht 5\' 4"  (1.626 m)   SpO2 98%   BMI 36.05 kg/m    Review of Systems  Constitutional:  Negative for chills, fatigue and fever.  Eyes:  Negative for pain and itching.  Skin:  Positive for wound.    Physical Exam Vitals reviewed.  Constitutional:      Appearance: Normal appearance.  Skin:    Comments: Cellulitis noted on left posterior knee.  Neurological:     Mental Status: She is alert.    Assessment/Plan: 1. Tick bite of  left lower leg, initial encounter (Primary) Take Doxycycline  as discussed. Follow up via MyChart messenger if symptoms fail to improve or may return to clinic as needed for worsening symptoms.   - doxycycline  (VIBRA -TABS) 100 MG tablet; Take 1 tablet (100 mg total) by mouth 2 (two) times daily.  Dispense: 28 tablet; Refill: 0  2. Cellulitis of left lower extremity - doxycycline  (VIBRA -TABS) 100 MG tablet; Take 1 tablet (100 mg total) by mouth 2 (two) times daily.  Dispense: 28 tablet; Refill: 0     General Counseling: Gina Spencer verbalizes understanding of the findings of todays visit and agrees with plan of treatment. I have discussed any further diagnostic evaluation that may be needed or ordered today. We also reviewed her medications today. she has been encouraged to call the office with any questions or concerns that should arise related to todays visit.   No orders of the defined types were placed in this encounter.   Meds ordered this encounter  Medications   doxycycline  (VIBRA -TABS) 100 MG tablet    Sig: Take 1 tablet (100 mg total) by mouth 2 (two) times daily.    Dispense:  28 tablet    Refill:  0  Time spent:15 Minutes    Sheria Dills AGNP-C Nurse Practitioner

## 2024-04-30 ENCOUNTER — Other Ambulatory Visit: Payer: Self-pay | Admitting: Obstetrics and Gynecology

## 2024-04-30 DIAGNOSIS — Z1231 Encounter for screening mammogram for malignant neoplasm of breast: Secondary | ICD-10-CM

## 2024-05-05 ENCOUNTER — Inpatient Hospital Stay: Admission: RE | Admit: 2024-05-05 | Source: Ambulatory Visit

## 2024-05-05 ENCOUNTER — Ambulatory Visit
Admission: RE | Admit: 2024-05-05 | Discharge: 2024-05-05 | Disposition: A | Discharge status: Home / Self Care | Source: Ambulatory Visit | Attending: Obstetrics and Gynecology | Admitting: Obstetrics and Gynecology

## 2024-05-05 DIAGNOSIS — Z1231 Encounter for screening mammogram for malignant neoplasm of breast: Secondary | ICD-10-CM
